# Patient Record
Sex: Female | Born: 1945 | Race: White | Hispanic: No | Marital: Single | Smoking: Never smoker
Health system: Southern US, Community
[De-identification: ages and names within clinical notes are randomized; demographics above are authoritative.]

## PROBLEM LIST (undated history)

## (undated) ENCOUNTER — Ambulatory Visit: Admission: EM | Payer: Self-pay

## (undated) DIAGNOSIS — I739 Peripheral vascular disease, unspecified: Secondary | ICD-10-CM

## (undated) DIAGNOSIS — C801 Malignant (primary) neoplasm, unspecified: Secondary | ICD-10-CM

## (undated) DIAGNOSIS — I10 Essential (primary) hypertension: Secondary | ICD-10-CM

## (undated) DIAGNOSIS — M1990 Unspecified osteoarthritis, unspecified site: Secondary | ICD-10-CM

## (undated) HISTORY — PX: HERNIA REPAIR: SHX51

## (undated) HISTORY — PX: BREAST BIOPSY: SHX20

## (undated) HISTORY — PX: BREAST CYST EXCISION: SHX579

## (undated) HISTORY — PX: ABDOMINAL HYSTERECTOMY: SHX81

---

## 2012-06-01 DIAGNOSIS — N390 Urinary tract infection, site not specified: Secondary | ICD-10-CM | POA: Insufficient documentation

## 2012-06-01 DIAGNOSIS — M1711 Unilateral primary osteoarthritis, right knee: Secondary | ICD-10-CM

## 2012-06-01 DIAGNOSIS — Z8679 Personal history of other diseases of the circulatory system: Secondary | ICD-10-CM | POA: Insufficient documentation

## 2012-06-01 DIAGNOSIS — M1710 Unilateral primary osteoarthritis, unspecified knee: Secondary | ICD-10-CM | POA: Insufficient documentation

## 2012-06-01 DIAGNOSIS — G609 Hereditary and idiopathic neuropathy, unspecified: Secondary | ICD-10-CM

## 2012-06-01 HISTORY — DX: Unilateral primary osteoarthritis, right knee: M17.11

## 2012-06-01 HISTORY — DX: Unilateral primary osteoarthritis, unspecified knee: M17.10

## 2012-06-01 HISTORY — DX: Personal history of other diseases of the circulatory system: Z86.79

## 2012-06-01 HISTORY — DX: Urinary tract infection, site not specified: N39.0

## 2012-06-01 HISTORY — DX: Hereditary and idiopathic neuropathy, unspecified: G60.9

## 2012-09-25 DIAGNOSIS — I10 Essential (primary) hypertension: Secondary | ICD-10-CM

## 2012-09-25 HISTORY — DX: Essential (primary) hypertension: I10

## 2014-01-22 DIAGNOSIS — R072 Precordial pain: Secondary | ICD-10-CM

## 2014-01-22 HISTORY — DX: Precordial pain: R07.2

## 2014-02-04 DIAGNOSIS — I209 Angina pectoris, unspecified: Secondary | ICD-10-CM | POA: Insufficient documentation

## 2014-02-04 DIAGNOSIS — R9439 Abnormal result of other cardiovascular function study: Secondary | ICD-10-CM

## 2014-02-04 HISTORY — DX: Angina pectoris, unspecified: I20.9

## 2014-02-04 HISTORY — DX: Abnormal result of other cardiovascular function study: R94.39

## 2017-03-03 DIAGNOSIS — Z96659 Presence of unspecified artificial knee joint: Secondary | ICD-10-CM | POA: Insufficient documentation

## 2017-03-03 HISTORY — DX: Presence of unspecified artificial knee joint: Z96.659

## 2017-11-15 DIAGNOSIS — M1612 Unilateral primary osteoarthritis, left hip: Secondary | ICD-10-CM

## 2017-11-15 HISTORY — DX: Unilateral primary osteoarthritis, left hip: M16.12

## 2017-12-18 HISTORY — PX: JOINT REPLACEMENT: SHX530

## 2018-01-11 DIAGNOSIS — Z96642 Presence of left artificial hip joint: Secondary | ICD-10-CM

## 2018-01-11 HISTORY — DX: Presence of left artificial hip joint: Z96.642

## 2018-04-19 HISTORY — PX: MOHS SURGERY: SHX181

## 2019-01-26 DIAGNOSIS — S8263XA Displaced fracture of lateral malleolus of unspecified fibula, initial encounter for closed fracture: Secondary | ICD-10-CM | POA: Insufficient documentation

## 2019-01-26 HISTORY — DX: Displaced fracture of lateral malleolus of unspecified fibula, initial encounter for closed fracture: S82.63XA

## 2019-09-04 DIAGNOSIS — M1991 Primary osteoarthritis, unspecified site: Secondary | ICD-10-CM

## 2019-09-04 DIAGNOSIS — I10 Essential (primary) hypertension: Secondary | ICD-10-CM

## 2019-09-04 DIAGNOSIS — M546 Pain in thoracic spine: Secondary | ICD-10-CM

## 2019-09-04 HISTORY — DX: Primary osteoarthritis, unspecified site: M19.91

## 2019-09-04 HISTORY — DX: Essential (primary) hypertension: I10

## 2019-09-04 HISTORY — DX: Pain in thoracic spine: M54.6

## 2020-05-13 DIAGNOSIS — M1711 Unilateral primary osteoarthritis, right knee: Secondary | ICD-10-CM | POA: Diagnosis not present

## 2020-05-17 DIAGNOSIS — M25561 Pain in right knee: Secondary | ICD-10-CM | POA: Diagnosis not present

## 2020-05-17 DIAGNOSIS — M1711 Unilateral primary osteoarthritis, right knee: Secondary | ICD-10-CM | POA: Diagnosis not present

## 2020-05-20 DIAGNOSIS — I10 Essential (primary) hypertension: Secondary | ICD-10-CM | POA: Diagnosis not present

## 2020-05-24 DIAGNOSIS — M1711 Unilateral primary osteoarthritis, right knee: Secondary | ICD-10-CM | POA: Diagnosis not present

## 2020-05-24 DIAGNOSIS — M25561 Pain in right knee: Secondary | ICD-10-CM | POA: Diagnosis not present

## 2020-05-28 DIAGNOSIS — S46011A Strain of muscle(s) and tendon(s) of the rotator cuff of right shoulder, initial encounter: Secondary | ICD-10-CM | POA: Diagnosis not present

## 2020-06-06 DIAGNOSIS — M1711 Unilateral primary osteoarthritis, right knee: Secondary | ICD-10-CM | POA: Diagnosis not present

## 2020-06-06 DIAGNOSIS — M25561 Pain in right knee: Secondary | ICD-10-CM | POA: Diagnosis not present

## 2020-06-12 DIAGNOSIS — M1711 Unilateral primary osteoarthritis, right knee: Secondary | ICD-10-CM | POA: Diagnosis not present

## 2020-06-14 DIAGNOSIS — M25561 Pain in right knee: Secondary | ICD-10-CM | POA: Diagnosis not present

## 2020-06-14 DIAGNOSIS — M1711 Unilateral primary osteoarthritis, right knee: Secondary | ICD-10-CM | POA: Diagnosis not present

## 2020-06-19 DIAGNOSIS — I10 Essential (primary) hypertension: Secondary | ICD-10-CM | POA: Diagnosis not present

## 2020-06-19 DIAGNOSIS — M1612 Unilateral primary osteoarthritis, left hip: Secondary | ICD-10-CM | POA: Diagnosis not present

## 2020-06-19 DIAGNOSIS — M25561 Pain in right knee: Secondary | ICD-10-CM | POA: Diagnosis not present

## 2020-06-19 DIAGNOSIS — G8918 Other acute postprocedural pain: Secondary | ICD-10-CM | POA: Diagnosis not present

## 2020-06-19 DIAGNOSIS — M1711 Unilateral primary osteoarthritis, right knee: Secondary | ICD-10-CM | POA: Diagnosis not present

## 2020-06-19 DIAGNOSIS — Z96641 Presence of right artificial hip joint: Secondary | ICD-10-CM | POA: Diagnosis not present

## 2020-06-19 DIAGNOSIS — Z880 Allergy status to penicillin: Secondary | ICD-10-CM | POA: Diagnosis not present

## 2020-06-19 DIAGNOSIS — Z96652 Presence of left artificial knee joint: Secondary | ICD-10-CM | POA: Diagnosis not present

## 2020-06-19 DIAGNOSIS — Z885 Allergy status to narcotic agent status: Secondary | ICD-10-CM | POA: Diagnosis not present

## 2020-06-20 DIAGNOSIS — Z885 Allergy status to narcotic agent status: Secondary | ICD-10-CM | POA: Diagnosis not present

## 2020-06-20 DIAGNOSIS — Z880 Allergy status to penicillin: Secondary | ICD-10-CM | POA: Diagnosis not present

## 2020-06-20 DIAGNOSIS — Z96652 Presence of left artificial knee joint: Secondary | ICD-10-CM | POA: Diagnosis not present

## 2020-06-20 DIAGNOSIS — I10 Essential (primary) hypertension: Secondary | ICD-10-CM | POA: Diagnosis not present

## 2020-06-20 DIAGNOSIS — M1711 Unilateral primary osteoarthritis, right knee: Secondary | ICD-10-CM | POA: Diagnosis not present

## 2020-06-20 DIAGNOSIS — Z96641 Presence of right artificial hip joint: Secondary | ICD-10-CM | POA: Diagnosis not present

## 2020-06-20 DIAGNOSIS — M1612 Unilateral primary osteoarthritis, left hip: Secondary | ICD-10-CM | POA: Diagnosis not present

## 2020-06-25 DIAGNOSIS — M25661 Stiffness of right knee, not elsewhere classified: Secondary | ICD-10-CM | POA: Diagnosis not present

## 2020-06-25 DIAGNOSIS — M1711 Unilateral primary osteoarthritis, right knee: Secondary | ICD-10-CM | POA: Diagnosis not present

## 2020-06-25 DIAGNOSIS — M25561 Pain in right knee: Secondary | ICD-10-CM | POA: Diagnosis not present

## 2020-06-26 DIAGNOSIS — M25561 Pain in right knee: Secondary | ICD-10-CM | POA: Diagnosis not present

## 2020-06-26 DIAGNOSIS — M1711 Unilateral primary osteoarthritis, right knee: Secondary | ICD-10-CM | POA: Diagnosis not present

## 2020-06-26 DIAGNOSIS — M25661 Stiffness of right knee, not elsewhere classified: Secondary | ICD-10-CM | POA: Diagnosis not present

## 2020-07-02 DIAGNOSIS — M25561 Pain in right knee: Secondary | ICD-10-CM | POA: Diagnosis not present

## 2020-07-02 DIAGNOSIS — M1711 Unilateral primary osteoarthritis, right knee: Secondary | ICD-10-CM | POA: Diagnosis not present

## 2020-07-02 DIAGNOSIS — M25661 Stiffness of right knee, not elsewhere classified: Secondary | ICD-10-CM | POA: Diagnosis not present

## 2020-07-04 DIAGNOSIS — M1711 Unilateral primary osteoarthritis, right knee: Secondary | ICD-10-CM | POA: Diagnosis not present

## 2020-07-04 DIAGNOSIS — M25561 Pain in right knee: Secondary | ICD-10-CM | POA: Diagnosis not present

## 2020-07-04 DIAGNOSIS — M25661 Stiffness of right knee, not elsewhere classified: Secondary | ICD-10-CM | POA: Diagnosis not present

## 2020-07-12 DIAGNOSIS — M25561 Pain in right knee: Secondary | ICD-10-CM | POA: Diagnosis not present

## 2020-07-12 DIAGNOSIS — M1711 Unilateral primary osteoarthritis, right knee: Secondary | ICD-10-CM | POA: Diagnosis not present

## 2020-07-12 DIAGNOSIS — M25661 Stiffness of right knee, not elsewhere classified: Secondary | ICD-10-CM | POA: Diagnosis not present

## 2020-07-15 DIAGNOSIS — M25661 Stiffness of right knee, not elsewhere classified: Secondary | ICD-10-CM | POA: Diagnosis not present

## 2020-07-15 DIAGNOSIS — M25561 Pain in right knee: Secondary | ICD-10-CM | POA: Diagnosis not present

## 2020-07-15 DIAGNOSIS — M1711 Unilateral primary osteoarthritis, right knee: Secondary | ICD-10-CM | POA: Diagnosis not present

## 2020-07-18 DIAGNOSIS — M25661 Stiffness of right knee, not elsewhere classified: Secondary | ICD-10-CM | POA: Diagnosis not present

## 2020-07-18 DIAGNOSIS — M1711 Unilateral primary osteoarthritis, right knee: Secondary | ICD-10-CM | POA: Diagnosis not present

## 2020-07-18 DIAGNOSIS — M25561 Pain in right knee: Secondary | ICD-10-CM | POA: Diagnosis not present

## 2020-07-22 DIAGNOSIS — Z96651 Presence of right artificial knee joint: Secondary | ICD-10-CM | POA: Diagnosis not present

## 2020-07-22 DIAGNOSIS — R600 Localized edema: Secondary | ICD-10-CM | POA: Diagnosis not present

## 2020-07-22 DIAGNOSIS — M1711 Unilateral primary osteoarthritis, right knee: Secondary | ICD-10-CM | POA: Diagnosis not present

## 2020-07-25 DIAGNOSIS — M1711 Unilateral primary osteoarthritis, right knee: Secondary | ICD-10-CM | POA: Diagnosis not present

## 2020-07-25 DIAGNOSIS — M25561 Pain in right knee: Secondary | ICD-10-CM | POA: Diagnosis not present

## 2020-07-25 DIAGNOSIS — M25661 Stiffness of right knee, not elsewhere classified: Secondary | ICD-10-CM | POA: Diagnosis not present

## 2020-07-31 DIAGNOSIS — M25561 Pain in right knee: Secondary | ICD-10-CM | POA: Diagnosis not present

## 2020-07-31 DIAGNOSIS — M1711 Unilateral primary osteoarthritis, right knee: Secondary | ICD-10-CM | POA: Diagnosis not present

## 2020-07-31 DIAGNOSIS — M25661 Stiffness of right knee, not elsewhere classified: Secondary | ICD-10-CM | POA: Diagnosis not present

## 2020-08-02 DIAGNOSIS — M25661 Stiffness of right knee, not elsewhere classified: Secondary | ICD-10-CM | POA: Diagnosis not present

## 2020-08-02 DIAGNOSIS — M25561 Pain in right knee: Secondary | ICD-10-CM | POA: Diagnosis not present

## 2020-08-02 DIAGNOSIS — M1711 Unilateral primary osteoarthritis, right knee: Secondary | ICD-10-CM | POA: Diagnosis not present

## 2020-08-07 DIAGNOSIS — M1711 Unilateral primary osteoarthritis, right knee: Secondary | ICD-10-CM | POA: Diagnosis not present

## 2020-08-07 DIAGNOSIS — M25561 Pain in right knee: Secondary | ICD-10-CM | POA: Diagnosis not present

## 2020-08-07 DIAGNOSIS — M25661 Stiffness of right knee, not elsewhere classified: Secondary | ICD-10-CM | POA: Diagnosis not present

## 2020-08-09 DIAGNOSIS — M25661 Stiffness of right knee, not elsewhere classified: Secondary | ICD-10-CM | POA: Diagnosis not present

## 2020-08-09 DIAGNOSIS — M1711 Unilateral primary osteoarthritis, right knee: Secondary | ICD-10-CM | POA: Diagnosis not present

## 2020-08-09 DIAGNOSIS — M25561 Pain in right knee: Secondary | ICD-10-CM | POA: Diagnosis not present

## 2020-08-19 DIAGNOSIS — Z86010 Personal history of colonic polyps: Secondary | ICD-10-CM | POA: Diagnosis not present

## 2020-08-19 DIAGNOSIS — K625 Hemorrhage of anus and rectum: Secondary | ICD-10-CM | POA: Diagnosis not present

## 2020-08-27 DIAGNOSIS — M1909 Primary osteoarthritis, other specified site: Secondary | ICD-10-CM | POA: Diagnosis not present

## 2020-08-27 DIAGNOSIS — I10 Essential (primary) hypertension: Secondary | ICD-10-CM | POA: Diagnosis not present

## 2020-09-02 ENCOUNTER — Emergency Department
Admission: EM | Admit: 2020-09-02 | Discharge: 2020-09-02 | Disposition: A | Discharge status: Home / Self Care | Payer: Medicare HMO | Attending: Emergency Medicine | Admitting: Emergency Medicine

## 2020-09-02 ENCOUNTER — Other Ambulatory Visit: Payer: Self-pay

## 2020-09-02 DIAGNOSIS — S51831A Puncture wound without foreign body of right forearm, initial encounter: Secondary | ICD-10-CM | POA: Diagnosis not present

## 2020-09-02 DIAGNOSIS — Z7982 Long term (current) use of aspirin: Secondary | ICD-10-CM | POA: Insufficient documentation

## 2020-09-02 DIAGNOSIS — W5501XA Bitten by cat, initial encounter: Secondary | ICD-10-CM | POA: Diagnosis not present

## 2020-09-02 DIAGNOSIS — S51851A Open bite of right forearm, initial encounter: Secondary | ICD-10-CM | POA: Diagnosis not present

## 2020-09-02 DIAGNOSIS — S59911A Unspecified injury of right forearm, initial encounter: Secondary | ICD-10-CM | POA: Diagnosis present

## 2020-09-02 MED ORDER — CLINDAMYCIN PHOSPHATE 600 MG/50ML IV SOLN
600.0000 mg | Freq: Once | INTRAVENOUS | Status: AC
  Administered 2020-09-02: 600 mg via INTRAVENOUS
  Filled 2020-09-02: qty 50

## 2020-09-02 MED ORDER — CLINDAMYCIN HCL 300 MG PO CAPS: 300.0000 mg | ORAL_CAPSULE | Freq: Three times a day (TID) | ORAL | 0 refills | Status: AC

## 2020-09-02 MED ORDER — DOXYCYCLINE MONOHYDRATE 100 MG PO CAPS: 100.0000 mg | ORAL_CAPSULE | Freq: Two times a day (BID) | ORAL | 0 refills | Status: DC

## 2020-09-02 NOTE — ED Triage Notes (Signed)
PT reports that she was bitten yesterday by a friends cat, cat has all vaccinations UTD. Pt was bitten to rt forearm.

## 2020-09-02 NOTE — ED Provider Notes (Signed)
[facility] Emergency Department Provider Note   ____________________________________________   Event Date/Time   First MD Initiated Contact with Patient 09/02/20 1102     (approximate)  I have reviewed the triage vital signs and the nursing notes.   HISTORY  Chief Complaint Animal Bite    HPI Sara Nguyen is a 75 y.o. female patient presents with cat bite to the right forearm.  Incident occurred yesterday.  Patient has documentation showed that the cat immunizations are up-to-date.  Patient last tetanus shot was 4 years ago.  Patient was sent from the urgent care clinic for IV antibiotics.  Patient rates her pain as a 6/10.  Patient described pain as "sore".  No palliative measure prior to arrival.  Patient denies loss of sensation or function.         No past medical history on file.  There are no problems to display for this patient.     Prior to Admission medications   Medication Sig Start Date End Date Taking? Authorizing Provider  aspirin EC 81 MG tablet Take 81 mg by mouth daily. Swallow whole.   Yes [provider]  clindamycin (CLEOCIN) 300 MG capsule Take 1 capsule (300 mg total) by mouth 3 (three) times daily for 10 days. 09/02/20 09/12/20 Yes Sable Feil, PA-C  doxycycline (MONODOX) 100 MG capsule Take 1 capsule (100 mg total) by mouth 2 (two) times daily. 09/02/20  Yes Sable Feil, PA-C  hydrochlorothiazide (HYDRODIURIL) 25 MG tablet Take 25 mg by mouth daily.   Yes [provider]  losartan (COZAAR) 25 MG tablet Take 25 mg by mouth daily.   Yes [provider]    Allergies Codeine, Morphine and related, Penicillins, Tramadol, and Tylenol [acetaminophen]  No family history on file.  Social History    Review of Systems Constitutional: No fever/chills Eyes: No visual changes. ENT: No sore throat. Cardiovascular: Denies chest pain. Respiratory: Denies shortness of  breath. Gastrointestinal: No abdominal pain.  No nausea, no vomiting.  No diarrhea.  No constipation. Genitourinary: Negative for dysuria. Musculoskeletal: Negative for back pain. Skin: Negative for rash.  Cat bite right forearm. Neurological: Negative for headaches, focal weakness or numbness. Endocrine:  Hypertension. Allergic/Immunilogical: Codeine, morphine, penicillin, tramadol, and Tylenol. ____________________________________________   PHYSICAL EXAM:  VITAL SIGNS: ED Triage Vitals  Enc Vitals Group     BP 09/02/20 1047 (!) 168/99     Pulse Rate 09/02/20 1047 84     Resp 09/02/20 1047 18     Temp 09/02/20 1047 98.3 F (36.8 C)     Temp Source 09/02/20 1047 Oral     SpO2 09/02/20 1047 99 %     Weight 09/02/20 1048 173 lb (78.5 kg)     Height 09/02/20 1048 5\' 7"  (1.702 m)     Head Circumference --      Peak Flow --      Pain Score 09/02/20 1047 6     Pain Loc --      Pain Edu? --      Excl. in [place]? --     Constitutional: Alert and oriented. Well appearing and in no acute distress. Cardiovascular: Normal rate, regular rhythm. Grossly normal heart sounds.  Good peripheral circulation.  Elevated blood pressure. Respiratory: Normal respiratory effort.  No retractions. Lungs CTAB. Musculoskeletal: No lower extremity tenderness nor edema.  No joint effusions. Neurologic:  Normal speech and language. No gross focal neurologic deficits are appreciated. No gait instability. Skin: 4  puncture wounds to right forearm.  Mild erythema.   Psychiatric: Mood and affect are normal. Speech and behavior are normal.  ____________________________________________   LABS (all labs ordered are listed, but only abnormal results are displayed)  Labs Reviewed - No data to display ____________________________________________  EKG   ____________________________________________  RADIOLOGY I, Sable Feil, personally viewed and evaluated these images (plain radiographs) as part of my  medical decision making, as well as reviewing the written report by the radiologist.  ED MD interpretation:    Official radiology report(s): No results found.  ____________________________________________   PROCEDURES  Procedure(s) performed (including Critical Care):  Procedures   ____________________________________________   INITIAL IMPRESSION / ASSESSMENT AND PLAN / ED COURSE  As part of my medical decision making, I reviewed the following data within the [facility]         Patient presents with cellulitis secondary to cat bite to the right forearm.  Patient given IV antibiotics in the ED and switch to oral medication upon discharge.  Patient advised to return back to ED if condition worsens.  Verified that cats immunizations are up-to-date.      ____________________________________________   FINAL CLINICAL IMPRESSION(S) / ED DIAGNOSES  Final diagnoses:  Cat bite, initial encounter     ED Discharge Orders         Ordered    doxycycline (MONODOX) 100 MG capsule  2 times daily        09/02/20 1148    clindamycin (CLEOCIN) 300 MG capsule  3 times daily        09/02/20 1148          *Please note:  Sara Nguyen was evaluated in Emergency Department on 09/02/2020 for the symptoms described in the history of present illness. She was evaluated in the context of the global COVID-19 pandemic, which necessitated consideration that the patient might be at risk for infection with the SARS-CoV-2 virus that causes COVID-19. Institutional protocols and algorithms that pertain to the evaluation of patients at risk for COVID-19 are in a state of rapid change based on information released by regulatory bodies including the CDC and federal and state organizations. These policies and algorithms were followed during the patient's care in the ED.  Some ED evaluations and interventions may be delayed as a result of limited staffing during and the  pandemic.*   Note:  This document was prepared using Dragon voice recognition software and may include unintentional dictation errors.    Sable Feil, PA-C 09/02/20 1148    Harvest Dark, MD 09/02/20 [phone]

## 2020-09-02 NOTE — ED Notes (Signed)
See triage note.

## 2020-09-02 NOTE — Discharge Instructions (Signed)
Read and follow discharge care instructions.  Take medication as directed.  Return to ED if condition worsens.

## 2020-09-02 NOTE — ED Notes (Signed)
See triage note  Presents with cat bite to right forearm  States was bitten yesterday  Arm is red and swollen  Tender to touch

## 2020-09-18 DIAGNOSIS — Z0000 Encounter for general adult medical examination without abnormal findings: Secondary | ICD-10-CM | POA: Diagnosis not present

## 2020-09-18 DIAGNOSIS — W5501XD Bitten by cat, subsequent encounter: Secondary | ICD-10-CM | POA: Diagnosis not present

## 2020-09-18 DIAGNOSIS — Z9889 Other specified postprocedural states: Secondary | ICD-10-CM

## 2020-09-18 DIAGNOSIS — I10 Essential (primary) hypertension: Secondary | ICD-10-CM | POA: Diagnosis not present

## 2020-09-18 DIAGNOSIS — M2550 Pain in unspecified joint: Secondary | ICD-10-CM | POA: Diagnosis not present

## 2020-09-18 DIAGNOSIS — Z1231 Encounter for screening mammogram for malignant neoplasm of breast: Secondary | ICD-10-CM | POA: Diagnosis not present

## 2020-09-18 DIAGNOSIS — L989 Disorder of the skin and subcutaneous tissue, unspecified: Secondary | ICD-10-CM | POA: Diagnosis not present

## 2020-09-18 HISTORY — DX: Other specified postprocedural states: Z98.890

## 2020-09-23 ENCOUNTER — Other Ambulatory Visit: Payer: Self-pay | Admitting: Infectious Diseases

## 2020-09-23 DIAGNOSIS — Z1231 Encounter for screening mammogram for malignant neoplasm of breast: Secondary | ICD-10-CM

## 2020-09-30 ENCOUNTER — Other Ambulatory Visit: Payer: Self-pay

## 2020-09-30 ENCOUNTER — Ambulatory Visit
Admission: RE | Admit: 2020-09-30 | Discharge: 2020-09-30 | Disposition: A | Discharge status: Home / Self Care | Payer: Medicare HMO | Source: Ambulatory Visit | Attending: Infectious Diseases | Admitting: Infectious Diseases

## 2020-09-30 DIAGNOSIS — Z1231 Encounter for screening mammogram for malignant neoplasm of breast: Secondary | ICD-10-CM | POA: Diagnosis not present

## 2020-10-01 ENCOUNTER — Inpatient Hospital Stay
Admission: RE | Admit: 2020-10-01 | Discharge: 2020-10-01 | Disposition: A | Discharge status: Home / Self Care | Payer: Self-pay | Source: Ambulatory Visit | Attending: *Deleted | Admitting: *Deleted

## 2020-10-01 ENCOUNTER — Other Ambulatory Visit: Payer: Self-pay | Admitting: *Deleted

## 2020-10-01 DIAGNOSIS — Z1231 Encounter for screening mammogram for malignant neoplasm of breast: Secondary | ICD-10-CM

## 2020-10-07 DIAGNOSIS — L821 Other seborrheic keratosis: Secondary | ICD-10-CM | POA: Diagnosis not present

## 2020-10-07 DIAGNOSIS — L298 Other pruritus: Secondary | ICD-10-CM | POA: Diagnosis not present

## 2020-10-07 DIAGNOSIS — L578 Other skin changes due to chronic exposure to nonionizing radiation: Secondary | ICD-10-CM | POA: Diagnosis not present

## 2020-10-07 DIAGNOSIS — L905 Scar conditions and fibrosis of skin: Secondary | ICD-10-CM | POA: Diagnosis not present

## 2020-10-07 DIAGNOSIS — L304 Erythema intertrigo: Secondary | ICD-10-CM | POA: Diagnosis not present

## 2020-10-13 DIAGNOSIS — Z6830 Body mass index (BMI) 30.0-30.9, adult: Secondary | ICD-10-CM | POA: Diagnosis not present

## 2020-10-13 DIAGNOSIS — R42 Dizziness and giddiness: Secondary | ICD-10-CM | POA: Diagnosis not present

## 2020-10-13 DIAGNOSIS — R079 Chest pain, unspecified: Secondary | ICD-10-CM | POA: Diagnosis not present

## 2020-10-13 DIAGNOSIS — R000 Tachycardia, unspecified: Secondary | ICD-10-CM | POA: Diagnosis not present

## 2020-10-13 DIAGNOSIS — E6609 Other obesity due to excess calories: Secondary | ICD-10-CM | POA: Diagnosis not present

## 2020-10-13 DIAGNOSIS — I208 Other forms of angina pectoris: Secondary | ICD-10-CM | POA: Diagnosis not present

## 2020-10-13 DIAGNOSIS — R002 Palpitations: Secondary | ICD-10-CM | POA: Diagnosis not present

## 2020-10-13 DIAGNOSIS — I10 Essential (primary) hypertension: Secondary | ICD-10-CM | POA: Diagnosis not present

## 2020-10-17 DIAGNOSIS — R309 Painful micturition, unspecified: Secondary | ICD-10-CM | POA: Diagnosis not present

## 2020-10-17 DIAGNOSIS — R3915 Urgency of urination: Secondary | ICD-10-CM | POA: Diagnosis not present

## 2020-11-06 ENCOUNTER — Ambulatory Visit: Admit: 2020-11-06 | Payer: Self-pay

## 2020-11-06 SURGERY — COLONOSCOPY WITH PROPOFOL
Anesthesia: General

## 2020-11-11 ENCOUNTER — Other Ambulatory Visit: Payer: Self-pay

## 2020-11-11 ENCOUNTER — Encounter: Payer: Self-pay | Admitting: *Deleted

## 2020-11-11 ENCOUNTER — Emergency Department
Admission: EM | Admit: 2020-11-11 | Discharge: 2020-11-12 | Disposition: A | Discharge status: Home / Self Care | Payer: Medicare HMO | Attending: Emergency Medicine | Admitting: Emergency Medicine

## 2020-11-11 ENCOUNTER — Emergency Department: Payer: Medicare HMO

## 2020-11-11 DIAGNOSIS — Z79899 Other long term (current) drug therapy: Secondary | ICD-10-CM | POA: Diagnosis not present

## 2020-11-11 DIAGNOSIS — Z87440 Personal history of urinary (tract) infections: Secondary | ICD-10-CM | POA: Insufficient documentation

## 2020-11-11 DIAGNOSIS — N12 Tubulo-interstitial nephritis, not specified as acute or chronic: Secondary | ICD-10-CM | POA: Insufficient documentation

## 2020-11-11 DIAGNOSIS — K5790 Diverticulosis of intestine, part unspecified, without perforation or abscess without bleeding: Secondary | ICD-10-CM | POA: Insufficient documentation

## 2020-11-11 DIAGNOSIS — Z7982 Long term (current) use of aspirin: Secondary | ICD-10-CM | POA: Insufficient documentation

## 2020-11-11 DIAGNOSIS — K760 Fatty (change of) liver, not elsewhere classified: Secondary | ICD-10-CM | POA: Diagnosis not present

## 2020-11-11 DIAGNOSIS — Z96642 Presence of left artificial hip joint: Secondary | ICD-10-CM | POA: Insufficient documentation

## 2020-11-11 DIAGNOSIS — Z9889 Other specified postprocedural states: Secondary | ICD-10-CM | POA: Diagnosis not present

## 2020-11-11 DIAGNOSIS — R350 Frequency of micturition: Secondary | ICD-10-CM | POA: Diagnosis present

## 2020-11-11 DIAGNOSIS — I10 Essential (primary) hypertension: Secondary | ICD-10-CM | POA: Diagnosis not present

## 2020-11-11 LAB — CBC
HCT: 39.3 % (ref 36.0–46.0)
Hemoglobin: 14.1 g/dL (ref 12.0–15.0)
MCH: 33.8 pg (ref 26.0–34.0)
MCHC: 35.9 g/dL (ref 30.0–36.0)
MCV: 94.2 fL (ref 80.0–100.0)
Platelets: 248 10*3/uL (ref 150–400)
RBC: 4.17 MIL/uL (ref 3.87–5.11)
RDW: 13.8 % (ref 11.5–15.5)
WBC: 10.5 10*3/uL (ref 4.0–10.5)
nRBC: 0 % (ref 0.0–0.2)

## 2020-11-11 LAB — URINALYSIS, COMPLETE (UACMP) WITH MICROSCOPIC
Bilirubin Urine: NEGATIVE
Glucose, UA: NEGATIVE mg/dL
Hgb urine dipstick: NEGATIVE
Ketones, ur: NEGATIVE mg/dL
Nitrite: NEGATIVE
Protein, ur: 100 mg/dL — AB
Specific Gravity, Urine: 1.015 (ref 1.005–1.030)
WBC, UA: >50 WBC/hpf — ABNORMAL HIGH (ref 0–5)
pH: 6 (ref 5.0–8.0)

## 2020-11-11 LAB — COMPREHENSIVE METABOLIC PANEL
ALT: 19 U/L (ref 0–44)
AST: 23 U/L (ref 15–41)
Albumin: 4 g/dL (ref 3.5–5.0)
Alkaline Phosphatase: 87 U/L (ref 38–126)
Anion gap: 11 (ref 5–15)
BUN: 25 mg/dL — ABNORMAL HIGH (ref 8–23)
CO2: 23 mmol/L (ref 22–32)
Calcium: 9.5 mg/dL (ref 8.9–10.3)
Chloride: 102 mmol/L (ref 98–111)
Creatinine, Ser: 1.07 mg/dL — ABNORMAL HIGH (ref 0.44–1.00)
GFR, Estimated: 55 mL/min — ABNORMAL LOW (ref >60)
Glucose, Bld: 135 mg/dL — ABNORMAL HIGH (ref 70–99)
Potassium: 3.7 mmol/L (ref 3.5–5.1)
Sodium: 136 mmol/L (ref 135–145)
Total Bilirubin: 1.2 mg/dL (ref 0.3–1.2)
Total Protein: 7.1 g/dL (ref 6.5–8.1)

## 2020-11-11 LAB — LACTIC ACID, PLASMA: Lactic Acid, Venous: 0.9 mmol/L (ref 0.5–1.9)

## 2020-11-11 LAB — PROCALCITONIN: Procalcitonin: 1.02 ng/mL

## 2020-11-11 MED ORDER — CIPROFLOXACIN IN D5W 400 MG/200ML IV SOLN
400.0000 mg | Freq: Once | INTRAVENOUS | Status: AC
  Administered 2020-11-11: 400 mg via INTRAVENOUS
  Filled 2020-11-11: qty 200

## 2020-11-11 MED ORDER — LACTATED RINGERS IV BOLUS
1000.0000 mL | Freq: Once | INTRAVENOUS | Status: AC
  Administered 2020-11-11: 1000 mL via INTRAVENOUS

## 2020-11-11 MED ORDER — IOHEXOL 350 MG/ML SOLN
75.0000 mL | Freq: Once | INTRAVENOUS | Status: AC | PRN
  Administered 2020-11-11: 75 mL via INTRAVENOUS

## 2020-11-11 NOTE — ED Triage Notes (Signed)
Pt with recent uti and taking cipro.  Pt reports sx worse and not feeling well.  Pt has nausea.  Pt alert

## 2020-11-11 NOTE — ED Notes (Signed)
Pt unable to void at this time. 

## 2020-11-11 NOTE — ED Provider Notes (Signed)
[facility] Emergency Department Provider Note  ____________________________________________   Event Date/Time   First MD Initiated Contact with Patient 11/11/20 2253     (approximate)  I have reviewed the triage vital signs and the nursing notes.   HISTORY  Chief Complaint Dysuria and Back Pain   HPI TORIANN PACKER is a 75 y.o. female with a past medical history of recurrent UTIs who presents for assessment of their 4 days of worsening burning with urination and frequency associate with 1 day of some left lower back pain rating around the left flank associated with some subjective fevers and decreased p.o. intake.  Patient states she had to take 1 dose of ciprofloxacin was prescribed her but she feels has not yet helped.  She states she got over a UTI earlier in the month with Cipro and Cipro usually works for her but she has never had pain in her back.  She denies any headache, earache, sore throat, vomiting, chest pain, cough, shortness of breath, right-sided back or abdominal pain, abnormal vaginal bleeding or discharge, rash or extremity pain.  She has no history of stones or kidney infections.  She has no other acute concerns at this time.  She did take 2 ibuprofen prior to arrival.         No past medical history on file.  There are no problems to display for this patient.   Past Surgical History:  Procedure Laterality Date   BREAST BIOPSY Right    unknown year- benign   BREAST CYST EXCISION Left    1965 or so- benign    Prior to Admission medications   Medication Sig Start Date End Date Taking? Authorizing Provider  ciprofloxacin (CIPRO) 500 MG tablet Take 1 tablet (500 mg total) by mouth 2 (two) times daily for 7 days. 11/12/20 11/19/20 Yes Lucrezia Starch, MD  aspirin EC 81 MG tablet Take 81 mg by mouth daily. Swallow whole.    [provider]  hydrochlorothiazide (HYDRODIURIL) 25 MG tablet Take 25 mg by mouth daily.     [provider]  losartan (COZAAR) 25 MG tablet Take 25 mg by mouth daily.    [provider]    Allergies Codeine, Morphine and related, Penicillins, Tramadol, and Tylenol [acetaminophen]  Family History  Problem Relation Age of Onset   Breast cancer Cousin     Social History Social History   Tobacco Use   Smoking status: Never   Smokeless tobacco: Never  Substance Use Topics   Alcohol use: Yes    Review of Systems  Review of Systems  Constitutional:  Positive for chills, fever and malaise/fatigue.  HENT:  Negative for sore throat.   Eyes:  Negative for pain.  Respiratory:  Negative for cough and stridor.   Cardiovascular:  Negative for chest pain.  Gastrointestinal:  Positive for nausea. Negative for vomiting.  Genitourinary:  Positive for dysuria, flank pain, frequency and urgency.  Musculoskeletal:  Positive for back pain (L lower). Negative for myalgias.  Skin:  Negative for rash.  Neurological:  Negative for seizures, loss of consciousness and headaches.  Psychiatric/Behavioral:  Negative for suicidal ideas.   All other systems reviewed and are negative.    ____________________________________________   PHYSICAL EXAM:  VITAL SIGNS: ED Triage Vitals  Enc Vitals Group     BP 11/11/20 2011 (!) 160/90     Pulse Rate 11/11/20 2011 (!) 120     Resp 11/11/20 2011 20     Temp  11/11/20 2011 99.9 F (37.7 C)     Temp Source 11/11/20 2011 Oral     SpO2 11/11/20 2011 100 %     Weight 11/11/20 2009 171 lb (77.6 kg)     Height 11/11/20 2009 '5\' 7"'$  (1.702 m)     Head Circumference --      Peak Flow --      Pain Score 11/11/20 2009 4     Pain Loc --      Pain Edu? --      Excl. in [place]? --    Vitals:   11/11/20 2011 11/12/20 0000  BP: (!) 160/90 (!) 155/81  Pulse: (!) 120 89  Resp: 20 18  Temp: 99.9 F (37.7 C)   SpO2: 100% 99%   Physical Exam Vitals and nursing note reviewed.  Constitutional:      General: She is not in acute  distress.    Appearance: She is well-developed.  HENT:     Head: Normocephalic and atraumatic.     Right Ear: External ear normal.     Left Ear: External ear normal.     Nose: Nose normal.     Mouth/Throat:     Mouth: Mucous membranes are dry.  Eyes:     Conjunctiva/sclera: Conjunctivae normal.  Cardiovascular:     Rate and Rhythm: Regular rhythm. Tachycardia present.     Pulses: Normal pulses.     Heart sounds: No murmur heard. Pulmonary:     Effort: Pulmonary effort is normal. No respiratory distress.     Breath sounds: Normal breath sounds.  Abdominal:     Palpations: Abdomen is soft.     Tenderness: There is no abdominal tenderness. There is left CVA tenderness. There is no right CVA tenderness.  Musculoskeletal:     Cervical back: Neck supple.  Skin:    General: Skin is warm and dry.     Capillary Refill: Capillary refill takes less than 2 seconds.  Neurological:     Mental Status: She is alert and oriented to person, place, and time.     ____________________________________________   LABS (all labs ordered are listed, but only abnormal results are displayed)  Labs Reviewed  COMPREHENSIVE METABOLIC PANEL - Abnormal; Notable for the following components:      Result Value   Glucose, Bld 135 (*)    BUN 25 (*)    Creatinine, Ser 1.07 (*)    GFR, Estimated 55 (*)    All other components within normal limits  URINALYSIS, COMPLETE (UACMP) WITH MICROSCOPIC - Abnormal; Notable for the following components:   Color, Urine YELLOW (*)    APPearance CLOUDY (*)    Protein, ur 100 (*)    Leukocytes,Ua LARGE (*)    WBC, UA >50 (*)    Bacteria, UA RARE (*)    All other components within normal limits  URINE CULTURE  CULTURE, BLOOD (SINGLE) W REFLEX TO ID PANEL  CBC  LACTIC ACID, PLASMA  PROCALCITONIN  PROCALCITONIN   ____________________________________________  EKG  ____________________________________________  RADIOLOGY  ED MD interpretation: CT abdomen pelvis  markable for possible tiny cystocele without any other clear acute urinary tract abnormalities noted.  No evidence of kidney stone hydronephrosis or perinephric stranding.  There is diverticulosis without evidence of diverticulitis and no evidence of appendicitis, pancreatitis or other clear acute abdominopelvic pathology.   Official radiology report(s): CT ABDOMEN PELVIS W CONTRAST  Result Date: 11/12/2020 CLINICAL DATA:  Recent UTI, ciprofloxacin, worsening symptoms and generalized malaise. EXAM: CT  ABDOMEN AND PELVIS WITH CONTRAST TECHNIQUE: Multidetector CT imaging of the abdomen and pelvis was performed using the standard protocol following bolus administration of intravenous contrast. CONTRAST:  57m OMNIPAQUE IOHEXOL 350 MG/ML SOLN COMPARISON:  None. FINDINGS: Lower chest: Atelectatic changes otherwise clear lung bases. Normal heart size. No pericardial effusion. Hepatobiliary: Diffuse hepatic hypoattenuation compatible with hepatic steatosis. No concerning focal liver lesion. Smooth liver surface contour. Normal gallbladder and biliary tree. No visible calcified gallstone or biliary ductal dilatation. Pancreas: No pancreatic ductal dilatation or surrounding inflammatory changes. Spleen: Normal in size. No concerning splenic lesions. Adrenals/Urinary Tract: Normal adrenals. Kidneys are normally located with symmetric enhancementand excretion. Bilateral extrarenal pelves, anatomic variant. No suspicious renal lesion, urolithiasis or hydronephrosis. Question tiny cystocele, urinary bladder is otherwise unremarkable. (6/64, 2/74). Stomach/Bowel: Distal esophagus, stomach and duodenal sweep are unremarkable. No small bowel wall thickening or dilatation. No evidence of obstruction. A normal appendix is visualized. No colonic dilatation or wall thickening. Hepatic flexure interposed anterior to the liver. And colonic diverticulosis without focal inflammation to suggest an acute diverticulitis.  Vascular/Lymphatic: Atherosclerotic calcifications within the abdominal aorta and branch vessels. No aneurysm or ectasia. No enlarged abdominopelvic lymph nodes. Reproductive: Uterus is surgically absent. No concerning adnexal lesions. Concerning adnexal abnormality the portion of the left hemipelvis is obscured by streak artifact. Other: No abdominopelvic free air or fluid. No bowel containing hernia. Evidence of prior ventral and right groin hernia repair. Additional postsurgical changes related to left hip arthroplasty further detailed below. Musculoskeletal: Evidence of prior total left hip arthroplasty in expected alignment without acute hardware complication or failure. 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 Additional degenerative changes in the SI joints, symphysis pubis and with discogenic and facet degenerative changes in the lower lumbar spine. Grade 1 anterolisthesis L3 on 4 without spondylolysis. IMPRESSION: 1. Question a tiny cystocele, correlate with patient's symptoms. 2. No other acute urinary tract abnormality is seen. Specifically, no evidence of urolithiasis, hydronephrosis or CT evidence of urinary tract infection at this time. Hepatic steatosis. 3. Diverticulosis without evidence of acute diverticulitis. 4. 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 5. Remote ventral and right groin hernia repair. 6. Prior hysterectomy. 7. Aortic Atherosclerosis (ICD10-I70.0). Electronically Signed   By: PLovena LeM.D.   On: 11/12/2020 00:53    ____________________________________________   PROCEDURES  Procedure(s) performed (including Critical Care):  .1-3 Lead EKG Interpretation  Date/Time: 11/12/2020 1:48 AM Performed by: SLucrezia Starch MD Authorized by: SLucrezia Starch MD     Interpretation: normal     ECG rate assessment: normal     Rhythm: sinus rhythm     Ectopy: none     Conduction:  normal     ____________________________________________   INITIAL IMPRESSION / ASSESSMENT AND PLAN / ED COURSE      Presents with above-stated history exam for assessment of couple days of some burning with urination and frequency associated with 1 day of some left lower back pain rating around the left flank and some subjective fevers.  On arrival patient is tachycardic at 120 with slight elevated temperature at 99.9, hypertensive at 160/90 with otherwise stable vital signs on room air.  Her abdomen is soft nontender throughout she does have some left CVA tenderness and appears quite dehydrated on exam.  Primary differential includes pyelonephritis, kidney stone, cystitis, and diverticulitis.  No rash to suggest shingles or midline or right-sided symptoms to suggest pancreatitis or cholecystitis.  CMP  shows no significant electrolyte or metabolic derangements.  CBC shows no leukocytosis or acute anemia.  UA suggestive of urinary tract infection with large leukocyte esterase and 11-20 WBCs and 3-50 WBCs with bacteria seen.  Lactic acid is nonelevated 0.9.  CT abdomen pelvis markable for possible tiny cystocele without any other clear acute urinary tract abnormalities noted.  No evidence of kidney stone hydronephrosis or perinephric stranding.  There is diverticulosis without evidence of diverticulitis and no evidence of appendicitis, pancreatitis or other clear acute abdominopelvic pathology.   While there is no evidence of perinephric stranding on CT given CVA tenderness no findings on UA clinically patient is pyelonephritis.  She was initially tachycardic this may been due to pain is resolved on reassessment and overall do not believe she is septic at this time she has no leukocytosis, nonelevated lactic acid, no hypotension or other suggestive factors at this time.  Pending initial work-up prior to labs returning and CT she did receive an IV dose of Cipro given allergy to penicillins and blood  and urine cultures were ordered.  She declined any IV analgesia or antiemetics in the emergency room.  I do think she is stable for discharge with close outpatient follow-up.  She is currently on Cipro on a 5-day course although will extend this to 7 days.  Instructed follow-up with PCP.  Discharge stable condition.  Strict return cautions advised and discussed.  Patient declined Rx for antiemetic.     ____________________________________________   FINAL CLINICAL IMPRESSION(S) / ED DIAGNOSES  Final diagnoses:  Pyelonephritis    Medications  lactated ringers bolus 1,000 mL (0 mLs Intravenous Stopped 11/12/20 0025)  ciprofloxacin (CIPRO) IVPB 400 mg (0 mg Intravenous Stopped 11/12/20 0044)  iohexol (OMNIPAQUE) 350 MG/ML injection 75 mL (75 mLs Intravenous Contrast Given 11/11/20 2334)  ibuprofen (ADVIL) tablet 400 mg (400 mg Oral Given 11/12/20 0111)     ED Discharge Orders          Ordered    ciprofloxacin (CIPRO) 500 MG tablet  2 times daily        11/12/20 0127             Note:  This document was prepared using Dragon voice recognition software and may include unintentional dictation errors.    Lucrezia Starch, MD 11/12/20 [phone]

## 2020-11-12 DIAGNOSIS — N12 Tubulo-interstitial nephritis, not specified as acute or chronic: Secondary | ICD-10-CM | POA: Diagnosis not present

## 2020-11-12 MED ORDER — IBUPROFEN 400 MG PO TABS
400.0000 mg | ORAL_TABLET | Freq: Once | ORAL | Status: AC
  Administered 2020-11-12: 400 mg via ORAL
  Filled 2020-11-12: qty 1

## 2020-11-12 MED ORDER — CIPROFLOXACIN HCL 500 MG PO TABS: 500.0000 mg | ORAL_TABLET | Freq: Two times a day (BID) | ORAL | 0 refills | Status: AC

## 2020-11-14 LAB — URINE CULTURE: Culture: 30000 — AB

## 2020-11-15 NOTE — Progress Notes (Signed)
Patient grew ESBL E. Coli, resistant to ciprofloxacin. Spoke with patient, who stated Dr. Ola Spurr had already followed up with her regarding culture results and changed antibiotics. Nothing further to be done by pharmacy.    Wynelle Cleveland, PharmD Pharmacy Resident  11/15/2020 3:08 PM

## 2020-11-17 DIAGNOSIS — N390 Urinary tract infection, site not specified: Secondary | ICD-10-CM | POA: Diagnosis not present

## 2020-12-24 ENCOUNTER — Encounter: Payer: Self-pay | Admitting: *Deleted

## 2020-12-25 ENCOUNTER — Encounter
Admission: RE | Disposition: A | Discharge status: Home / Self Care | Payer: Self-pay | Source: Home / Self Care | Attending: Gastroenterology

## 2020-12-25 ENCOUNTER — Ambulatory Visit: Payer: Medicare HMO | Admitting: Anesthesiology

## 2020-12-25 ENCOUNTER — Ambulatory Visit
Admission: RE | Admit: 2020-12-25 | Discharge: 2020-12-25 | Disposition: A | Discharge status: Home / Self Care | Payer: Medicare HMO | Attending: Gastroenterology | Admitting: Gastroenterology

## 2020-12-25 ENCOUNTER — Encounter: Payer: Self-pay | Admitting: Anesthesiology

## 2020-12-25 DIAGNOSIS — Z8719 Personal history of other diseases of the digestive system: Secondary | ICD-10-CM | POA: Insufficient documentation

## 2020-12-25 DIAGNOSIS — Z885 Allergy status to narcotic agent status: Secondary | ICD-10-CM | POA: Insufficient documentation

## 2020-12-25 DIAGNOSIS — Z886 Allergy status to analgesic agent status: Secondary | ICD-10-CM | POA: Insufficient documentation

## 2020-12-25 DIAGNOSIS — K635 Polyp of colon: Secondary | ICD-10-CM | POA: Diagnosis not present

## 2020-12-25 DIAGNOSIS — K5730 Diverticulosis of large intestine without perforation or abscess without bleeding: Secondary | ICD-10-CM | POA: Insufficient documentation

## 2020-12-25 DIAGNOSIS — Z79899 Other long term (current) drug therapy: Secondary | ICD-10-CM | POA: Diagnosis not present

## 2020-12-25 DIAGNOSIS — K649 Unspecified hemorrhoids: Secondary | ICD-10-CM | POA: Diagnosis not present

## 2020-12-25 DIAGNOSIS — Z888 Allergy status to other drugs, medicaments and biological substances status: Secondary | ICD-10-CM | POA: Insufficient documentation

## 2020-12-25 DIAGNOSIS — K625 Hemorrhage of anus and rectum: Secondary | ICD-10-CM | POA: Insufficient documentation

## 2020-12-25 DIAGNOSIS — Z880 Allergy status to penicillin: Secondary | ICD-10-CM | POA: Diagnosis not present

## 2020-12-25 DIAGNOSIS — K640 First degree hemorrhoids: Secondary | ICD-10-CM | POA: Insufficient documentation

## 2020-12-25 DIAGNOSIS — Z86010 Personal history of colonic polyps: Secondary | ICD-10-CM | POA: Diagnosis not present

## 2020-12-25 DIAGNOSIS — K5790 Diverticulosis of intestine, part unspecified, without perforation or abscess without bleeding: Secondary | ICD-10-CM | POA: Diagnosis not present

## 2020-12-25 DIAGNOSIS — Z1211 Encounter for screening for malignant neoplasm of colon: Secondary | ICD-10-CM | POA: Diagnosis not present

## 2020-12-25 HISTORY — DX: Essential (primary) hypertension: I10

## 2020-12-25 HISTORY — PX: COLONOSCOPY WITH PROPOFOL: SHX5780

## 2020-12-25 HISTORY — DX: Malignant (primary) neoplasm, unspecified: C80.1

## 2020-12-25 SURGERY — COLONOSCOPY WITH PROPOFOL
Anesthesia: General

## 2020-12-25 MED ORDER — PROPOFOL 500 MG/50ML IV EMUL
INTRAVENOUS | Status: DC | PRN
  Administered 2020-12-25: 150 ug/kg/min via INTRAVENOUS

## 2020-12-25 MED ORDER — PROPOFOL 500 MG/50ML IV EMUL
INTRAVENOUS | Status: AC
  Filled 2020-12-25: qty 50

## 2020-12-25 MED ORDER — SODIUM CHLORIDE 0.9 % IV SOLN
INTRAVENOUS | Status: DC
  Administered 2020-12-25: 1000 mL via INTRAVENOUS

## 2020-12-25 NOTE — Transfer of Care (Signed)
Immediate Anesthesia Transfer of Care Note  Patient: Sara Nguyen  Procedure(s) Performed: COLONOSCOPY WITH PROPOFOL  Patient Location: PACU  Anesthesia Type:General  Level of Consciousness: awake and sedated  Airway & Oxygen Therapy: Patient Spontanous Breathing and Patient connected to nasal cannula oxygen  Post-op Assessment: Report given to RN and Post -op Vital signs reviewed and stable  Post vital signs: Reviewed and stable  Last Vitals:  Vitals Value Taken Time  BP    Temp    Pulse    Resp    SpO2      Last Pain:  Vitals:   12/25/20 1150  TempSrc: Temporal  PainSc: 0-No pain         Complications: No notable events documented.

## 2020-12-25 NOTE — Anesthesia Postprocedure Evaluation (Signed)
Anesthesia Post Note  Patient: Sara Nguyen  Procedure(s) Performed: COLONOSCOPY WITH PROPOFOL  Patient location during evaluation: PACU Anesthesia Type: General Level of consciousness: awake and alert Pain management: pain level controlled Vital Signs Assessment: post-procedure vital signs reviewed and stable Respiratory status: spontaneous breathing, nonlabored ventilation and respiratory function stable Cardiovascular status: blood pressure returned to baseline and stable Postop Assessment: no apparent nausea or vomiting Anesthetic complications: no   No notable events documented.   Last Vitals:  Vitals:   12/25/20 1313 12/25/20 1323  BP: 120/69 135/71  Pulse: 80 73  Resp: 16 13  Temp:    SpO2: 98% 100%    Last Pain:  Vitals:   12/25/20 1323  TempSrc:   PainSc: 0-No pain                 Iran Ouch

## 2020-12-25 NOTE — Interval H&P Note (Signed)
History and Physical Interval Note: Preprocedure H&P from 12/25/20  was reviewed and there was no interval change after seeing and examining the patient.  Written consent was obtained from the patient after discussion of risks, benefits, and alternatives. Patient has consented to proceed with Colonoscopy with possible intervention  12/25/2020 12:23 PM  Sara Nguyen  has presented today for surgery, with the diagnosis of PRS HX COLON POLYPS.  The various methods of treatment have been discussed with the patient and family. After consideration of risks, benefits and other options for treatment, the patient has consented to  Procedure(s): COLONOSCOPY WITH PROPOFOL (N/A) as a surgical intervention.  The patient's history has been reviewed, patient examined, no change in status, stable for surgery.  I have reviewed the patient's chart and labs.  Questions were answered to the patient's satisfaction.     Annamaria Helling

## 2020-12-25 NOTE — Op Note (Addendum)
[facility] Gastroenterology Patient Name: Sara Nguyen Procedure Date: 12/25/2020 12:27 PM MRN: [id number] Account #: [id number] Date of Birth: Aug 19, 1945 Admit Type: Outpatient Age: 75 Room: [facility] ENDO ROOM 1 Gender: Female Note Status: Finalized Instrument Name: Colonscope [id number] Procedure:             Colonoscopy Indications:           High risk colon cancer surveillance: Personal history                         of colonic polyps Providers:             Rueben Bash, DO Referring MD:          Adrian Prows (Referring MD) Medicines:             Monitored Anesthesia Care Complications:         No immediate complications. Estimated blood loss:                         Minimal. Procedure:             Pre-Anesthesia Assessment:                        - Prior to the procedure, a History and Physical was                         performed, and patient medications and allergies were                         reviewed. The patient is competent. The risks and                         benefits of the procedure and the sedation options and                         risks were discussed with the patient. All questions                         were answered and informed consent was obtained.                         Patient identification and proposed procedure were                         verified by the physician, the nurse, the anesthetist                         and the technician in the endoscopy suite. Mental                         Status Examination: alert and oriented. Airway                         Examination: normal oropharyngeal airway and neck                         mobility. Respiratory Examination: clear to  auscultation. CV Examination: RRR, no murmurs, no S3                         or S4. Prophylactic Antibiotics: The patient does not                         require prophylactic antibiotics. Prior                          Anticoagulants: The patient has taken no previous                         anticoagulant or antiplatelet agents. ASA Grade                         Assessment: II - A patient with mild systemic disease.                         After reviewing the risks and benefits, the patient                         was deemed in satisfactory condition to undergo the                         procedure. The anesthesia plan was to use monitored                         anesthesia care (MAC). Immediately prior to                         administration of medications, the patient was                         re-assessed for adequacy to receive sedatives. The                         heart rate, respiratory rate, oxygen saturations,                         blood pressure, adequacy of pulmonary ventilation, and                         response to care were monitored throughout the                         procedure. The physical status of the patient was                         re-assessed after the procedure.                        After obtaining informed consent, the colonoscope was                         passed under direct vision. Throughout the procedure,                         the patient's blood pressure, pulse, and oxygen  saturations were monitored continuously. The                         Colonoscope was introduced through the anus and                         advanced to the the terminal ileum, with                         identification of the appendiceal orifice and IC                         valve. The colonoscopy was performed without                         difficulty. The patient tolerated the procedure well.                         The quality of the bowel preparation was evaluated                         using the BBPS [facility] Bowel Preparation Scale) with                         scores of: Right Colon = 3, Transverse Colon = 3 and                         Left Colon = 3  (entire mucosa seen well with no                         residual staining, small fragments of stool or opaque                         liquid). The total BBPS score equals 9. The terminal                         ileum, ileocecal valve, appendiceal orifice, and                         rectum were photographed. Findings:      The perianal and digital rectal examinations were normal. Pertinent       negatives include normal sphincter tone.      The terminal ileum appeared normal. Estimated blood loss: none.      A 2 to 3 mm polyp was found in the sigmoid colon. The polyp was sessile.       The polyp was removed with a cold biopsy forceps. Resection and       retrieval were complete. Estimated blood loss was minimal.      Multiple small-mouthed diverticula were found in the left colon.       Estimated blood loss: none.      Non-bleeding internal hemorrhoids were found during retroflexion. The       hemorrhoids were Grade I (internal hemorrhoids that do not prolapse).       Estimated blood loss: none.      The exam was otherwise without abnormality on direct and retroflexion       views. Impression:            -  The examined portion of the ileum was normal.                        - One 2 to 3 mm polyp in the sigmoid colon, removed                         with a cold biopsy forceps. Resected and retrieved.                        - Diverticulosis in the left colon.                        - Non-bleeding internal hemorrhoids.                        - The examination was otherwise normal on direct and                         retroflexion views. Recommendation:        - Discharge patient to home.                        - Resume previous diet.                        - Continue present medications.                        - Await pathology results.                        - Repeat colonoscopy in 7-10 years or discontinue                         screening due to age after informed discussion.                         - Return to referring physician as previously                         scheduled. Procedure Code(s):     --- Professional ---                        [phone], Colonoscopy, flexible; with biopsy, single or                         multiple Diagnosis Code(s):     --- Professional ---                        Z86.010, Personal history of colonic polyps                        K64.0, First degree hemorrhoids                        K63.5, Polyp of colon                        K57.30, Diverticulosis of large intestine without  perforation or abscess without bleeding CPT copyright 2019 American Medical Association. All rights reserved. The codes documented in this report are preliminary and upon coder review may  be revised to meet current compliance requirements. Attending Participation:      I personally performed the entire procedure. Volney American, DO Annamaria Helling DO, DO 12/25/2020 1:06:53 PM This report has been signed electronically. Number of Addenda: 0 Note Initiated On: 12/25/2020 12:27 PM Scope Withdrawal Time: 0 hours 11 minutes 36 seconds  Total Procedure Duration: 0 hours 21 minutes 56 seconds  Estimated Blood Loss:  Estimated blood loss was minimal.      [facility]

## 2020-12-25 NOTE — Anesthesia Procedure Notes (Signed)
Date/Time: 12/25/2020 12:29 PM Performed by: Vaughan Sine Pre-anesthesia Checklist: Patient identified, Emergency Drugs available, Suction available, Patient being monitored and Timeout performed Patient Re-evaluated:Patient Re-evaluated prior to induction Oxygen Delivery Method: Simple face mask Preoxygenation: Pre-oxygenation with 100% oxygen Induction Type: IV induction Placement Confirmation: positive ETCO2 and CO2 detector

## 2020-12-25 NOTE — H&P (Signed)
[facility] Pre-Procedure H&P   Patient ID: LIBBI RATHORE is a 75 y.o. female.  Gastroenterology Provider: Annamaria Helling, DO  Referring Provider: Laurine Blazer, PA PCP: Leonel Ramsay, MD  Date: 12/25/2020  HPI Ms. Sara Nguyen is a 75 y.o. female who presents today for Colonoscopy for personal history of colon polyps (surveillance) and bright red blood per rectum.  Rare brpr with wiping. Last colonoscopy 2018 [place]- polyp x2 unknown subtype; IH  Chronic nsaid use.  Patient denies nausea, vomiting, abdominal pain, diarrhea, constipation, melena, hematochezia, fever, chills, or weight loss  Past Medical History:  Diagnosis Date   Cancer ([place])    SKIN CANCER   Hypertension     Past Surgical History:  Procedure Laterality Date   ABDOMINAL HYSTERECTOMY     BREAST BIOPSY Right    unknown year- benign   BREAST CYST EXCISION Left    1965 or so- benign   HERNIA REPAIR     JOINT REPLACEMENT Left 12/2017   MOHS SURGERY Left 2020    Family History No h/o GI disease or malignancy  Review of Systems  Constitutional:  Negative for activity change, appetite change, fatigue, fever and unexpected weight change.  HENT:  Negative for trouble swallowing and voice change.   Respiratory:  Negative for shortness of breath and wheezing.   Cardiovascular:  Negative for chest pain and palpitations.  Gastrointestinal:  Positive for blood in stool (with wiping). Negative for abdominal distention, abdominal pain, anal bleeding, constipation, diarrhea, nausea, rectal pain and vomiting.  Musculoskeletal:  Positive for arthralgias. Negative for myalgias.  Skin:  Negative for color change and pallor.  Neurological:  Negative for dizziness, syncope and weakness.  Psychiatric/Behavioral:  Negative for agitation and confusion.   All other systems reviewed and are negative.   Medications No current facility-administered medications on file prior to  encounter.   Current Outpatient Medications on File Prior to Encounter  Medication Sig Dispense Refill   Ascorbic Acid (VITAMIN C) 1000 MG tablet Take 1,000 mg by mouth daily.     b complex vitamins capsule Take 1 capsule by mouth daily.     Calcium-Magnesium-Vitamin D (CALCIUM MAGNESIUM PO) Take by mouth 2 (two) times daily.     Cholecalciferol (VITAMIN D3) 1.25 MG (50000 UT) CAPS Take by mouth.     hydrochlorothiazide (HYDRODIURIL) 25 MG tablet Take 25 mg by mouth daily.     ibuprofen (ADVIL) 200 MG tablet Take 200-400 mg by mouth every 6 (six) hours as needed.     losartan (COZAAR) 25 MG tablet Take 25 mg by mouth daily.     MILK THISTLE PO Take 1,500 mg by mouth 2 (two) times daily.     Multiple Vitamin (MULTIVITAMIN) tablet Take by mouth daily.     aspirin EC 81 MG tablet Take 81 mg by mouth daily. Swallow whole.      Pertinent medications related to GI and procedure were reviewed by me with the patient prior to the procedure   Current Facility-Administered Medications:    0.9 %  sodium chloride infusion, , Intravenous, Continuous, Annamaria Helling, DO, Last Rate: 20 mL/hr at 12/25/20 1220, Continued from Pre-op at 12/25/20 1220  sodium chloride 20 mL/hr at 12/25/20 1220       Allergies  Allergen Reactions   Codeine    Morphine And Related    Other     GLUTEN PROTEIN   Penicillins    Tramadol Rash   Tylenol [Acetaminophen] Rash  Allergies were reviewed by me prior to the procedure  Objective    Vitals:   12/25/20 1150  BP: (!) 163/75  Pulse: (!) 104  Resp: 16  Temp: (!) 96.5 F (35.8 C)  TempSrc: Temporal  SpO2: 95%  Weight: 77.1 kg  Height: '5\' 7"'$  (1.702 m)    Physical Exam Vitals and nursing note reviewed.  Constitutional:      General: She is not in acute distress.    Appearance: Normal appearance. She is not ill-appearing, toxic-appearing or diaphoretic.  HENT:     Head: Normocephalic and atraumatic.     Nose: Nose normal.     Mouth/Throat:      Mouth: Mucous membranes are moist.     Pharynx: Oropharynx is clear.  Eyes:     General: No scleral icterus.    Extraocular Movements: Extraocular movements intact.  Cardiovascular:     Rate and Rhythm: Regular rhythm. Tachycardia present.     Heart sounds: Normal heart sounds. No murmur heard.   No friction rub. No gallop.  Pulmonary:     Effort: Pulmonary effort is normal. No respiratory distress.     Breath sounds: Normal breath sounds. No wheezing, rhonchi or rales.  Abdominal:     General: Bowel sounds are normal. There is no distension.     Palpations: Abdomen is soft.     Tenderness: There is no abdominal tenderness. There is no guarding or rebound.  Musculoskeletal:     Cervical back: Neck supple.     Right lower leg: No edema.     Left lower leg: No edema.  Skin:    General: Skin is warm and dry.     Coloration: Skin is not jaundiced or pale.  Neurological:     General: No focal deficit present.     Mental Status: She is alert and oriented to person, place, and time. Mental status is at baseline.  Psychiatric:        Mood and Affect: Mood normal.        Behavior: Behavior normal.        Thought Content: Thought content normal.        Judgment: Judgment normal.     Assessment:  Ms. Sara Nguyen is a 75 y.o. female  who presents today for Colonoscopy for personal history of colon polyps (surveillance) and bright red blood per rectum  Plan:  Colonoscopy with possible intervention today  Colonoscopy with possible biopsy, control of bleeding, polypectomy, and interventions as necessary has been discussed with the patient/patient representative. Informed consent was obtained from the patient/patient representative after explaining the indication, nature, and risks of the procedure including but not limited to death, bleeding, perforation, missed neoplasm/lesions, cardiorespiratory compromise, and reaction to medications. Opportunity for questions was given and  appropriate answers were provided. Patient/patient representative has verbalized understanding is amenable to undergoing the procedure.   Annamaria Helling, DO  [facility] Gastroenterology  Portions of the record may have been created with voice recognition software. Occasional wrong-word or 'sound-a-like' substitutions may have occurred due to the inherent limitations of voice recognition software.  Read the chart carefully and recognize, using context, where substitutions may have occurred.

## 2020-12-25 NOTE — Anesthesia Preprocedure Evaluation (Addendum)
Anesthesia Evaluation  Patient identified by MRN, date of birth, ID band Patient awake    Reviewed: Allergy & Precautions, NPO status , Patient's Chart, lab work & pertinent test results  Airway Mallampati: III  TM Distance: >3 FB Neck ROM: full    Dental  (+) Chipped   Pulmonary neg pulmonary ROS,    Pulmonary exam normal        Cardiovascular Exercise Tolerance: Good hypertension, Pt. on medications (-) angina (Past history of angina. Pt reports no recent episodes)Normal cardiovascular exam  Arteriosclerotic vascular disease   MPS 2020: CONCLUSIONS:  1. No significant symptoms with nonspecific ECG changes after pharmacologic stress.   2. The post stress ejection fraction is measured at 55 %.   3. There is an area of decreased activity in the inferoseptal left ventricle at rest and post stress with normal segmental contractility, suggestive of diaphragmatic attenuation.   4. There is an area of decreased activity in the anterior wall at rest and post stress with normal segmental contractility, suggestive of breast attenuation.   5. There is no ischemia     Neuro/Psych negative neurological ROS  negative psych ROS   GI/Hepatic negative GI ROS, Neg liver ROS,   Endo/Other  negative endocrine ROS  Renal/GU negative Renal ROS  negative genitourinary   Musculoskeletal   Abdominal   Peds  Hematology negative hematology ROS (+)   Anesthesia Other Findings Past Medical History: No date: Cancer ([place])     Comment:  SKIN CANCER No date: Hypertension  Past Surgical History: No date: ABDOMINAL HYSTERECTOMY No date: BREAST BIOPSY; Right     Comment:  unknown year- benign No date: BREAST CYST EXCISION; Left     Comment:  1965 or so- benign No date: HERNIA REPAIR 12/2017: JOINT REPLACEMENT; Left 2020: MOHS SURGERY; Left     Reproductive/Obstetrics negative OB ROS                            Anesthesia Physical Anesthesia Plan  ASA: 2  Anesthesia Plan: General   Post-op Pain Management:    Induction:   PONV Risk Score and Plan: Propofol infusion and TIVA  Airway Management Planned:   Additional Equipment:   Intra-op Plan:   Post-operative Plan:   Informed Consent: I have reviewed the patients History and Physical, chart, labs and discussed the procedure including the risks, benefits and alternatives for the proposed anesthesia with the patient or authorized representative who has indicated his/her understanding and acceptance.     Dental Advisory Given  Plan Discussed with: Anesthesiologist, CRNA and Surgeon  Anesthesia Plan Comments:         Anesthesia Quick Evaluation

## 2020-12-26 ENCOUNTER — Encounter: Payer: Self-pay | Admitting: Gastroenterology

## 2020-12-26 LAB — SURGICAL PATHOLOGY

## 2021-01-29 DIAGNOSIS — R309 Painful micturition, unspecified: Secondary | ICD-10-CM | POA: Diagnosis not present

## 2021-03-11 DIAGNOSIS — Z9889 Other specified postprocedural states: Secondary | ICD-10-CM | POA: Diagnosis not present

## 2021-03-11 DIAGNOSIS — I10 Essential (primary) hypertension: Secondary | ICD-10-CM | POA: Diagnosis not present

## 2021-03-11 DIAGNOSIS — M159 Polyosteoarthritis, unspecified: Secondary | ICD-10-CM | POA: Diagnosis not present

## 2021-03-17 DIAGNOSIS — R309 Painful micturition, unspecified: Secondary | ICD-10-CM | POA: Diagnosis not present

## 2021-03-20 DIAGNOSIS — Z79899 Other long term (current) drug therapy: Secondary | ICD-10-CM | POA: Diagnosis not present

## 2021-03-20 DIAGNOSIS — I10 Essential (primary) hypertension: Secondary | ICD-10-CM | POA: Diagnosis not present

## 2021-03-20 DIAGNOSIS — R5383 Other fatigue: Secondary | ICD-10-CM | POA: Diagnosis not present

## 2021-03-20 DIAGNOSIS — N390 Urinary tract infection, site not specified: Secondary | ICD-10-CM | POA: Diagnosis not present

## 2021-03-20 DIAGNOSIS — F32A Depression, unspecified: Secondary | ICD-10-CM | POA: Diagnosis not present

## 2021-04-02 ENCOUNTER — Ambulatory Visit: Payer: Medicare HMO | Admitting: Dermatology

## 2021-08-12 ENCOUNTER — Other Ambulatory Visit: Payer: Self-pay | Admitting: Student

## 2021-08-12 DIAGNOSIS — I208 Other forms of angina pectoris: Secondary | ICD-10-CM

## 2021-08-12 DIAGNOSIS — R079 Chest pain, unspecified: Secondary | ICD-10-CM

## 2021-08-13 ENCOUNTER — Other Ambulatory Visit (HOSPITAL_COMMUNITY): Payer: Self-pay | Admitting: Emergency Medicine

## 2021-08-13 DIAGNOSIS — R079 Chest pain, unspecified: Secondary | ICD-10-CM

## 2021-08-13 MED ORDER — METOPROLOL TARTRATE 100 MG PO TABS
100.0000 mg | ORAL_TABLET | Freq: Once | ORAL | 0 refills | Status: DC
Start: 2021-08-13 — End: 2022-12-21

## 2021-08-13 MED ORDER — IVABRADINE HCL 5 MG PO TABS
10.0000 mg | ORAL_TABLET | Freq: Once | ORAL | 0 refills | Status: AC
Start: 2021-08-13 — End: 2021-08-13

## 2021-08-14 ENCOUNTER — Telehealth (HOSPITAL_COMMUNITY): Payer: Self-pay | Admitting: Emergency Medicine

## 2021-08-14 NOTE — Telephone Encounter (Signed)
Reaching out to patient to offer assistance regarding upcoming cardiac imaging study; pt verbalizes understanding of appt date/time, parking situation and where to check in, pre-test NPO status and medications ordered, and verified current allergies; name and call back number provided for further questions should they arise ?Marchia Bond RN Navigator Cardiac Imaging ?[facility] ?[phone] office ?[phone] cell ? ?Denies iv issues ?'100mg'$  metoprolol + '10mg'$  ivabradine  ?Arrival 1215 ?

## 2021-08-17 ENCOUNTER — Ambulatory Visit
Admission: RE | Admit: 2021-08-17 | Discharge: 2021-08-17 | Disposition: A | Discharge status: Home / Self Care | Payer: Medicare HMO | Source: Ambulatory Visit | Attending: Student | Admitting: Student

## 2021-08-17 DIAGNOSIS — I208 Other forms of angina pectoris: Secondary | ICD-10-CM

## 2021-08-17 DIAGNOSIS — R079 Chest pain, unspecified: Secondary | ICD-10-CM | POA: Diagnosis not present

## 2021-08-17 LAB — POCT I-STAT CREATININE: Creatinine, Ser: 1 mg/dL (ref 0.44–1.00)

## 2021-08-17 MED ORDER — NITROGLYCERIN 0.4 MG SL SUBL
0.8000 mg | SUBLINGUAL_TABLET | Freq: Once | SUBLINGUAL | Status: AC
  Administered 2021-08-17: 0.8 mg via SUBLINGUAL

## 2021-08-17 MED ORDER — IOHEXOL 350 MG/ML SOLN
75.0000 mL | Freq: Once | INTRAVENOUS | Status: AC | PRN
  Administered 2021-08-17: 75 mL via INTRAVENOUS

## 2021-08-17 NOTE — Progress Notes (Signed)
Patient tolerated procedure well. Ambulate w/o difficulty. Denies light headedness or being dizzy. Sitting in chair drinking water provided. Encouraged to drink extra water today and reasoning explained. Verbalized understanding. All questions answered. ABC intact. No further needs. Discharge from procedure area w/o issues.   °

## 2021-09-29 ENCOUNTER — Other Ambulatory Visit: Payer: Self-pay | Admitting: Infectious Diseases

## 2021-09-29 DIAGNOSIS — Z1231 Encounter for screening mammogram for malignant neoplasm of breast: Secondary | ICD-10-CM

## 2021-10-23 ENCOUNTER — Ambulatory Visit
Admission: RE | Admit: 2021-10-23 | Discharge: 2021-10-23 | Disposition: A | Discharge status: Home / Self Care | Payer: Medicare HMO | Source: Ambulatory Visit | Attending: Infectious Diseases | Admitting: Infectious Diseases

## 2021-10-23 DIAGNOSIS — Z1231 Encounter for screening mammogram for malignant neoplasm of breast: Secondary | ICD-10-CM | POA: Diagnosis not present

## 2021-10-26 DIAGNOSIS — M159 Polyosteoarthritis, unspecified: Secondary | ICD-10-CM | POA: Diagnosis not present

## 2021-10-26 DIAGNOSIS — F330 Major depressive disorder, recurrent, mild: Secondary | ICD-10-CM | POA: Diagnosis not present

## 2021-10-26 DIAGNOSIS — R5383 Other fatigue: Secondary | ICD-10-CM | POA: Diagnosis not present

## 2021-10-26 DIAGNOSIS — I10 Essential (primary) hypertension: Secondary | ICD-10-CM | POA: Diagnosis not present

## 2021-11-02 DIAGNOSIS — D649 Anemia, unspecified: Secondary | ICD-10-CM | POA: Diagnosis not present

## 2021-11-02 DIAGNOSIS — I10 Essential (primary) hypertension: Secondary | ICD-10-CM | POA: Diagnosis not present

## 2021-11-02 DIAGNOSIS — Z79899 Other long term (current) drug therapy: Secondary | ICD-10-CM | POA: Diagnosis not present

## 2021-11-02 DIAGNOSIS — F411 Generalized anxiety disorder: Secondary | ICD-10-CM | POA: Diagnosis not present

## 2021-11-02 DIAGNOSIS — R7989 Other specified abnormal findings of blood chemistry: Secondary | ICD-10-CM | POA: Diagnosis not present

## 2021-11-02 DIAGNOSIS — F330 Major depressive disorder, recurrent, mild: Secondary | ICD-10-CM | POA: Diagnosis not present

## 2021-11-02 DIAGNOSIS — R5383 Other fatigue: Secondary | ICD-10-CM | POA: Diagnosis not present

## 2021-11-02 DIAGNOSIS — F32A Depression, unspecified: Secondary | ICD-10-CM | POA: Diagnosis not present

## 2021-11-24 DIAGNOSIS — Z961 Presence of intraocular lens: Secondary | ICD-10-CM | POA: Diagnosis not present

## 2021-11-24 DIAGNOSIS — H524 Presbyopia: Secondary | ICD-10-CM | POA: Diagnosis not present

## 2021-12-16 DIAGNOSIS — M25432 Effusion, left wrist: Secondary | ICD-10-CM | POA: Insufficient documentation

## 2021-12-16 DIAGNOSIS — M25532 Pain in left wrist: Secondary | ICD-10-CM | POA: Insufficient documentation

## 2021-12-16 DIAGNOSIS — M189 Osteoarthritis of first carpometacarpal joint, unspecified: Secondary | ICD-10-CM | POA: Insufficient documentation

## 2021-12-16 DIAGNOSIS — M19032 Primary osteoarthritis, left wrist: Secondary | ICD-10-CM

## 2021-12-16 DIAGNOSIS — M1812 Unilateral primary osteoarthritis of first carpometacarpal joint, left hand: Secondary | ICD-10-CM | POA: Diagnosis not present

## 2021-12-16 HISTORY — DX: Pain in left wrist: M25.532

## 2021-12-16 HISTORY — DX: Effusion, left wrist: M25.432

## 2021-12-16 HISTORY — DX: Osteoarthritis of first carpometacarpal joint, unspecified: M18.9

## 2021-12-16 HISTORY — DX: Primary osteoarthritis, left wrist: M19.032

## 2021-12-28 DIAGNOSIS — Z8679 Personal history of other diseases of the circulatory system: Secondary | ICD-10-CM | POA: Diagnosis not present

## 2021-12-28 DIAGNOSIS — R079 Chest pain, unspecified: Secondary | ICD-10-CM | POA: Diagnosis not present

## 2021-12-28 DIAGNOSIS — I2510 Atherosclerotic heart disease of native coronary artery without angina pectoris: Secondary | ICD-10-CM | POA: Diagnosis not present

## 2021-12-28 DIAGNOSIS — I10 Essential (primary) hypertension: Secondary | ICD-10-CM | POA: Diagnosis not present

## 2021-12-28 DIAGNOSIS — R5383 Other fatigue: Secondary | ICD-10-CM | POA: Diagnosis not present

## 2021-12-29 DIAGNOSIS — M1812 Unilateral primary osteoarthritis of first carpometacarpal joint, left hand: Secondary | ICD-10-CM | POA: Diagnosis not present

## 2021-12-29 DIAGNOSIS — M189 Osteoarthritis of first carpometacarpal joint, unspecified: Secondary | ICD-10-CM | POA: Diagnosis not present

## 2022-02-02 DIAGNOSIS — M2042 Other hammer toe(s) (acquired), left foot: Secondary | ICD-10-CM | POA: Diagnosis not present

## 2022-02-02 DIAGNOSIS — R918 Other nonspecific abnormal finding of lung field: Secondary | ICD-10-CM | POA: Diagnosis not present

## 2022-02-02 DIAGNOSIS — M79672 Pain in left foot: Secondary | ICD-10-CM | POA: Diagnosis not present

## 2022-02-02 DIAGNOSIS — K760 Fatty (change of) liver, not elsewhere classified: Secondary | ICD-10-CM | POA: Diagnosis not present

## 2022-02-02 DIAGNOSIS — F320 Major depressive disorder, single episode, mild: Secondary | ICD-10-CM | POA: Diagnosis not present

## 2022-02-02 DIAGNOSIS — I10 Essential (primary) hypertension: Secondary | ICD-10-CM | POA: Diagnosis not present

## 2022-02-02 DIAGNOSIS — R5383 Other fatigue: Secondary | ICD-10-CM | POA: Diagnosis not present

## 2022-02-04 ENCOUNTER — Other Ambulatory Visit: Payer: Self-pay | Admitting: Infectious Diseases

## 2022-02-04 DIAGNOSIS — R911 Solitary pulmonary nodule: Secondary | ICD-10-CM

## 2022-02-12 ENCOUNTER — Ambulatory Visit
Admission: RE | Admit: 2022-02-12 | Discharge: 2022-02-12 | Disposition: A | Discharge status: Home / Self Care | Payer: Medicare HMO | Source: Ambulatory Visit | Attending: Infectious Diseases | Admitting: Infectious Diseases

## 2022-02-12 DIAGNOSIS — I700 Atherosclerosis of aorta: Secondary | ICD-10-CM | POA: Diagnosis not present

## 2022-02-12 DIAGNOSIS — R911 Solitary pulmonary nodule: Secondary | ICD-10-CM | POA: Diagnosis not present

## 2022-02-23 DIAGNOSIS — R1084 Generalized abdominal pain: Secondary | ICD-10-CM | POA: Diagnosis not present

## 2022-02-23 DIAGNOSIS — R079 Chest pain, unspecified: Secondary | ICD-10-CM | POA: Diagnosis not present

## 2022-02-23 DIAGNOSIS — I2089 Other forms of angina pectoris: Secondary | ICD-10-CM | POA: Diagnosis not present

## 2022-02-23 DIAGNOSIS — I2510 Atherosclerotic heart disease of native coronary artery without angina pectoris: Secondary | ICD-10-CM | POA: Diagnosis not present

## 2022-02-23 DIAGNOSIS — W010XXA Fall on same level from slipping, tripping and stumbling without subsequent striking against object, initial encounter: Secondary | ICD-10-CM | POA: Diagnosis not present

## 2022-02-23 DIAGNOSIS — Z8679 Personal history of other diseases of the circulatory system: Secondary | ICD-10-CM | POA: Diagnosis not present

## 2022-02-23 DIAGNOSIS — I499 Cardiac arrhythmia, unspecified: Secondary | ICD-10-CM | POA: Diagnosis not present

## 2022-02-23 DIAGNOSIS — R5383 Other fatigue: Secondary | ICD-10-CM | POA: Diagnosis not present

## 2022-02-23 DIAGNOSIS — I10 Essential (primary) hypertension: Secondary | ICD-10-CM | POA: Diagnosis not present

## 2022-03-09 DIAGNOSIS — M2042 Other hammer toe(s) (acquired), left foot: Secondary | ICD-10-CM | POA: Diagnosis not present

## 2022-03-09 DIAGNOSIS — M79672 Pain in left foot: Secondary | ICD-10-CM | POA: Diagnosis not present

## 2022-03-10 ENCOUNTER — Other Ambulatory Visit: Payer: Self-pay | Admitting: Podiatry

## 2022-03-17 NOTE — Discharge Instructions (Signed)
[facility]  POST OPERATIVE INSTRUCTIONS FOR DR. FOWLER AND [facility] PODIATRY DEPARTMENT   Take your medication as prescribed.  Pain medication should be taken only as needed.  Keep the dressing clean, dry and intact.  Keep your foot elevated above the heart level for the first 48 hours.  Walking to the bathroom and brief periods of walking are acceptable, unless we have instructed you to be non-weight bearing.  Always wear your post-op shoe when walking.  Always use your crutches if you are to be non-weight bearing.  Do not take a shower. Baths are permissible as long as the foot is kept out of the water.   Every hour you are awake:  Bend your knee 15 times. Flex foot 15 times Massage calf 15 times  Call [facility] ([phone]) if any of the following problems occur: You develop a temperature or fever. The bandage becomes saturated with blood. Medication does not stop your pain. Injury of the foot occurs. Any symptoms of infection including redness, odor, or red streaks running from wound. 

## 2022-03-18 ENCOUNTER — Encounter: Payer: Self-pay | Admitting: Podiatry

## 2022-03-18 ENCOUNTER — Other Ambulatory Visit: Payer: Self-pay

## 2022-03-22 NOTE — Anesthesia Preprocedure Evaluation (Signed)
Anesthesia Evaluation  Patient identified by MRN, date of birth, ID band Patient awake    Reviewed: Allergy & Precautions, NPO status , Patient's Chart, lab work & pertinent test results  History of Anesthesia Complications Negative for: history of anesthetic complications  Airway Mallampati: III   Neck ROM: Full    Dental  (+) Missing Crowns :   Pulmonary neg pulmonary ROS   Pulmonary exam normal breath sounds clear to auscultation       Cardiovascular hypertension, + CAD and + Peripheral Vascular Disease  Normal cardiovascular exam Rhythm:Regular Rate:Normal  Echo 08/26/21:  NORMAL LEFT VENTRICULAR SYSTOLIC FUNCTION NORMAL RIGHT VENTRICULAR SYSTOLIC FUNCTION NO VALVULAR STENOSIS TRIVIAL MR MILD TR EF 50%   Myocardial perfusion 10/18/18:  1. No significant symptoms with nonspecific ECG changes after pharmacologic stress.  2. The post stress ejection fraction is measured at 55 %.  3. There is an area of decreased activity in the inferoseptal left ventricle at rest and post stress with normal segmental contractility, suggestive of diaphragmatic attenuation.  4. There is an area of decreased activity in the anterior wall at rest and post stress with normal segmental contractility, suggestive of breast attenuation.  5. There is no ischemia    Neuro/Psych negative neurological ROS     GI/Hepatic negative GI ROS,,,  Endo/Other  negative endocrine ROS    Renal/GU negative Renal ROS     Musculoskeletal  (+) Arthritis ,  Skin CA   Abdominal   Peds  Hematology negative hematology ROS (+)   Anesthesia Other Findings Cardiology note 02/23/22:  76 y.o. female with   1. Coronary artery disease involving native coronary artery of native heart without angina pectoris  2. Intermittent chest pain  3. Other fatigue  4. History of cardiac arrhythmia  5. Primary hypertension   Plan   CAD, chronic, stable  Intermittent  chest pain, resolved, the coronary CTA from 08/17/2021 revealed mild, nonobstructive CAD estimated at (25-49%).  Fatigue, chronic, fairly stable. Likely secondary to the aging process and poor physical conditioning. Echocardiogram from 08/26/2021 revealed normal LV systolic function with mild TR and estimated EF of 50%. Recommend lifestyle changes with increasing physical activity as above. Cardiac arrhythmia by history, stable. ECG on 08/12/2021 showed normal sinus rhythm with sinus arrhythmia sinus arrhythmia as above. Essential hypertension, chronic, well controlled. BP today is Continue losartan and hydrochlorothiazide therapy. Have patient follow up as needed  Return if symptoms worsen or fail to improve.    Reproductive/Obstetrics                             Anesthesia Physical Anesthesia Plan  ASA: 2  Anesthesia Plan: General   Post-op Pain Management:    Induction: Intravenous  PONV Risk Score and Plan: 3 and Propofol infusion, TIVA and Treatment may vary due to age or medical condition  Airway Management Planned: Natural Airway  Additional Equipment:   Intra-op Plan:   Post-operative Plan:   Informed Consent: I have reviewed the patients History and Physical, chart, labs and discussed the procedure including the risks, benefits and alternatives for the proposed anesthesia with the patient or authorized representative who has indicated his/her understanding and acceptance.       Plan Discussed with: CRNA  Anesthesia Plan Comments: (LMA/GETA backup discussed.  Patient consented for risks of anesthesia including but not limited to:  - adverse reactions to medications - damage to eyes, teeth, lips or other oral mucosa -  nerve damage due to positioning  - sore throat or hoarseness - damage to heart, brain, nerves, lungs, other parts of body or loss of life  Informed patient about role of CRNA in peri- and intra-operative care.  Patient voiced  understanding.)        Anesthesia Quick Evaluation

## 2022-03-24 ENCOUNTER — Ambulatory Visit: Payer: Medicare HMO | Admitting: Anesthesiology

## 2022-03-24 ENCOUNTER — Other Ambulatory Visit: Payer: Self-pay

## 2022-03-24 ENCOUNTER — Encounter: Payer: Self-pay | Admitting: Podiatry

## 2022-03-24 ENCOUNTER — Encounter
Admission: RE | Disposition: A | Discharge status: Home / Self Care | Payer: Self-pay | Source: Home / Self Care | Attending: Podiatry

## 2022-03-24 ENCOUNTER — Ambulatory Visit: Payer: Self-pay

## 2022-03-24 ENCOUNTER — Ambulatory Visit
Admission: RE | Admit: 2022-03-24 | Discharge: 2022-03-24 | Disposition: A | Discharge status: Home / Self Care | Payer: Medicare HMO | Attending: Podiatry | Admitting: Podiatry

## 2022-03-24 DIAGNOSIS — I10 Essential (primary) hypertension: Secondary | ICD-10-CM | POA: Diagnosis not present

## 2022-03-24 DIAGNOSIS — I2510 Atherosclerotic heart disease of native coronary artery without angina pectoris: Secondary | ICD-10-CM | POA: Insufficient documentation

## 2022-03-24 DIAGNOSIS — M2042 Other hammer toe(s) (acquired), left foot: Secondary | ICD-10-CM | POA: Insufficient documentation

## 2022-03-24 DIAGNOSIS — M898X7 Other specified disorders of bone, ankle and foot: Secondary | ICD-10-CM | POA: Diagnosis not present

## 2022-03-24 DIAGNOSIS — M899 Disorder of bone, unspecified: Secondary | ICD-10-CM | POA: Insufficient documentation

## 2022-03-24 DIAGNOSIS — I739 Peripheral vascular disease, unspecified: Secondary | ICD-10-CM | POA: Insufficient documentation

## 2022-03-24 HISTORY — PX: EXCISION PARTIAL PHALANX: SHX6617

## 2022-03-24 HISTORY — PX: HAMMER TOE SURGERY: SHX385

## 2022-03-24 HISTORY — DX: Unspecified osteoarthritis, unspecified site: M19.90

## 2022-03-24 HISTORY — DX: Peripheral vascular disease, unspecified: I73.9

## 2022-03-24 SURGERY — EXCISION, PHALANX, PARTIAL
Anesthesia: General | Site: Toe | Laterality: Left

## 2022-03-24 MED ORDER — POVIDONE-IODINE 10 % EX SWAB: 2.0000 | Freq: Once | CUTANEOUS | Status: DC

## 2022-03-24 MED ORDER — BUPIVACAINE HCL (PF) 0.25 % IJ SOLN
INTRAMUSCULAR | Status: DC | PRN
  Administered 2022-03-24: 4.5 mL

## 2022-03-24 MED ORDER — ONDANSETRON HCL 4 MG/2ML IJ SOLN
INTRAMUSCULAR | Status: DC | PRN
  Administered 2022-03-24: 4 mg via INTRAVENOUS

## 2022-03-24 MED ORDER — OXYCODONE HCL 5 MG PO TABS: 5.0000 mg | ORAL_TABLET | Freq: Three times a day (TID) | ORAL | 0 refills | Status: DC | PRN

## 2022-03-24 MED ORDER — FENTANYL CITRATE (PF) 100 MCG/2ML IJ SOLN
INTRAMUSCULAR | Status: DC | PRN
  Administered 2022-03-24 (×2): 50 ug via INTRAVENOUS

## 2022-03-24 MED ORDER — LACTATED RINGERS IV SOLN: INTRAVENOUS | Status: DC

## 2022-03-24 MED ORDER — MIDAZOLAM HCL 2 MG/2ML IJ SOLN: 2.0000 mg | Freq: Once | INTRAMUSCULAR | Status: DC

## 2022-03-24 MED ORDER — LIDOCAINE-EPINEPHRINE 1 %-1:100000 IJ SOLN
INTRAMUSCULAR | Status: DC | PRN
  Administered 2022-03-24: 4.5 mL

## 2022-03-24 MED ORDER — PROPOFOL 500 MG/50ML IV EMUL
INTRAVENOUS | Status: DC | PRN
  Administered 2022-03-24: 50 mg via INTRAVENOUS
  Administered 2022-03-24: 100 ug/kg/min via INTRAVENOUS

## 2022-03-24 MED ORDER — VANCOMYCIN HCL IN DEXTROSE 1-5 GM/200ML-% IV SOLN
1000.0000 mg | INTRAVENOUS | Status: AC
  Administered 2022-03-24: 1000 mg via INTRAVENOUS

## 2022-03-24 SURGICAL SUPPLY — 30 items
BLADE OSC/SAGITTAL MD 5.5X18 (BLADE) IMPLANT
BLADE SURG 15 STRL LF DISP TIS (BLADE) IMPLANT
BLADE SURG 15 STRL SS (BLADE) ×2
BNDG CMPR 75X41 PLY HI ABS (GAUZE/BANDAGES/DRESSINGS) ×2
BNDG ESMARK 4X12 TAN STRL LF (GAUZE/BANDAGES/DRESSINGS) ×2 IMPLANT
BNDG STRETCH 4X75 STRL LF (GAUZE/BANDAGES/DRESSINGS) ×2 IMPLANT
BUR FISSURE CARBIDE (BURR) IMPLANT
CANISTER SUCT 1200ML W/VALVE (MISCELLANEOUS) ×2 IMPLANT
COVER LIGHT HANDLE UNIVERSAL (MISCELLANEOUS) ×4 IMPLANT
CUFF TOURN SGL QUICK 18X4 (TOURNIQUET CUFF) IMPLANT
DRAPE FLUOR MINI C-ARM 54X84 (DRAPES) ×2 IMPLANT
DURAPREP 26ML APPLICATOR (WOUND CARE) ×2 IMPLANT
ELECT REM PT RETURN 9FT ADLT (ELECTROSURGICAL) ×2
ELECTRODE REM PT RTRN 9FT ADLT (ELECTROSURGICAL) ×2 IMPLANT
GAUZE SPONGE 4X4 12PLY STRL (GAUZE/BANDAGES/DRESSINGS) ×2 IMPLANT
GAUZE XEROFORM 1X8 LF (GAUZE/BANDAGES/DRESSINGS) ×2 IMPLANT
GLOVE SRG 8 PF TXTR STRL LF DI (GLOVE) ×2 IMPLANT
GLOVE SURG ENC MOIS LTX SZ7.5 (GLOVE) ×2 IMPLANT
GLOVE SURG UNDER POLY LF SZ8 (GLOVE) ×2
GOWN STRL REUS W/ TWL LRG LVL3 (GOWN DISPOSABLE) ×4 IMPLANT
GOWN STRL REUS W/TWL LRG LVL3 (GOWN DISPOSABLE) ×4
KIT TURNOVER KIT A (KITS) ×2 IMPLANT
NS IRRIG 500ML POUR BTL (IV SOLUTION) ×2 IMPLANT
PACK EXTREMITY ARMC (MISCELLANEOUS) ×2 IMPLANT
STOCKINETTE IMPERVIOUS LG (DRAPES) ×2 IMPLANT
SUT ETHILON 4-0 (SUTURE) ×4
SUT ETHILON 4-0 FS2 18XMFL BLK (SUTURE) ×4
SUT VIC AB 4-0 SH 27 (SUTURE) ×2
SUT VIC AB 4-0 SH 27XANBCTRL (SUTURE) IMPLANT
SUTURE ETHLN 4-0 FS2 18XMF BLK (SUTURE) IMPLANT

## 2022-03-24 NOTE — Anesthesia Postprocedure Evaluation (Signed)
Anesthesia Post Note  Patient: Sara Nguyen  Procedure(s) Performed: 11031 - EXCISION PARTIAL PHALANX - FOURTH (Left: Foot) HAMMER TOE CORRECTION - FIFTH (Left: Toe)  Patient location during evaluation: PACU Anesthesia Type: General Level of consciousness: awake and alert, oriented and patient cooperative Pain management: pain level controlled Vital Signs Assessment: post-procedure vital signs reviewed and stable Respiratory status: spontaneous breathing, nonlabored ventilation and respiratory function stable Cardiovascular status: blood pressure returned to baseline and stable Postop Assessment: adequate PO intake Anesthetic complications: no   No notable events documented.   Last Vitals:  Vitals:   03/24/22 1242 03/24/22 1451  BP: (!) 173/84 138/71  Pulse: 70 87  Resp: 18 19  Temp: (!) 36.2 C (!) 36.2 C  SpO2: 100% 98%    Last Pain:  Vitals:   03/24/22 1451  TempSrc:   PainSc: 0-No pain                 Darrin Nipper

## 2022-03-24 NOTE — Op Note (Signed)
Operative note   Surgeon:Abbigayle Toole Lawyer: None    Preop diagnosis: 1.  Hammertoe left fifth toe 2.  Exostosis left fourth toe    Postop diagnosis: Same    Procedure: 1.  Derotational arthroplasty left fifth toe 2.  Exostectomy PIPJ left fourth toe 3.  Intraoperative fluoroscopy use left foot    EBL: Minimal    Anesthesia:local and IV sedation.  Lidocaine with epinephrine was infiltrated along the incision sites dorsally.  The circumferential area around the toe was infiltrated with 0.25% bupivacaine    Hemostasis: Lidocaine with epinephrine infiltrated along the incision site.    Specimen: None    Complications: None    Operative indications:Sara Nguyen is an 76 y.o. that presents today for surgical intervention.  The risks/benefits/alternatives/complications have been discussed and consent has been given.    Procedure:  Patient was brought into the OR and placed on the operating table in thesupine position. After anesthesia was obtained theleft lower extremity was prepped and draped in usual sterile fashion.  Attention was directed to the left fifth toe where 2 semielliptical incisions were made from dorsal distal to proximal plantar.  Sharp and blunt dissection carried down to the tendon.  The ellipse of skin was removed.  The PIPJ was then exposed with a extensor tenotomy.  The head of the proximal phalanx was then excised with a power saw.  The toe was held in a derotated position.  The wound was flushed with copious amounts of irrigation.  The extensor tendon was reapproximated with a 4-0 Vicryl.  The skin was reapproximated with a 4-0 nylon.  Attention was then directed to the PIPJ of the left fourth toe laterally.  Sharp and blunt dissection carried down to the capsular region.  The capsule was entered and the lateral aspect of the head of the proximal phalanx was exposed.  At this time with a 1.6 mm sidecutting bur I was able to remove a prominent exostosis on  the lateral aspect of the proximal phalangeal joint.  Good removal and decompression was noted.  The overlying hyperkeratotic tissue at this level was then excised and debrided away.  The wound was flushed with copious amounts of irrigation.  Subcutaneous tissue was closed with a 4-0 Vicryl and the skin closed with a 4-0 nylon.  Pre and post surgical x-rays revealed the reduction of the head of the proximal phalanx as well as a derotation of the fifth toe and removal of the lateral condyle of the PIPJ of the fourth toe.  A bulky sterile dressing was then applied to the left foot.    Patient tolerated the procedure and anesthesia well.  Was transported from the OR to the PACU with all vital signs stable and vascular status intact. To be discharged per routine protocol.  Will follow up in approximately 1 week in the outpatient clinic.  A prescription for Roxicodone without Tylenol was sent to the pharmacy Tylenol was listed as an allergy.

## 2022-03-24 NOTE — Transfer of Care (Signed)
Immediate Anesthesia Transfer of Care Note  Patient: Sara Nguyen  Procedure(s) Performed: 57846 - EXCISION PARTIAL PHALANX - FOURTH (Left: Foot) HAMMER TOE CORRECTION - FIFTH (Left: Toe)  Patient Location: PACU  Anesthesia Type: MAC  Level of Consciousness: awake, alert  and patient cooperative  Airway and Oxygen Therapy: Patient Spontanous Breathing and Patient connected to supplemental oxygen  Post-op Assessment: Post-op Vital signs reviewed, Patient's Cardiovascular Status Stable, Respiratory Function Stable, Patent Airway and No signs of Nausea or vomiting  Post-op Vital Signs: Reviewed and stable  Complications: No notable events documented.

## 2022-03-24 NOTE — H&P (Signed)
HISTORY AND PHYSICAL INTERVAL NOTE:  03/24/2022  1:27 PM  Sara Nguyen  has presented today for surgery, with the diagnosis of M79.672 - Acute foot pain, left M20.42 - Hammer toe of left foot.  The various methods of treatment have been discussed with the patient.  No guarantees were given.  After consideration of risks, benefits and other options for treatment, the patient has consented to surgery.  I have reviewed the patients' chart and labs.     A history and physical examination was performed in my office.  The patient was reexamined.  There have been no changes to this history and physical examination.  Samara Deist A

## 2022-03-25 ENCOUNTER — Encounter: Payer: Self-pay | Admitting: Podiatry

## 2022-03-30 DIAGNOSIS — M2042 Other hammer toe(s) (acquired), left foot: Secondary | ICD-10-CM | POA: Diagnosis not present

## 2022-03-30 DIAGNOSIS — M79672 Pain in left foot: Secondary | ICD-10-CM | POA: Diagnosis not present

## 2022-04-18 IMAGING — MG MM DIGITAL SCREENING BILAT W/ TOMO AND CAD
8 series · 8 of 24 positions shown · non-contrast
Comparison: Previous exam(s).

CLINICAL DATA: Screening.

EXAM:
DIGITAL SCREENING BILATERAL MAMMOGRAM WITH TOMOSYNTHESIS AND CAD
TECHNIQUE: Bilateral screening digital craniocaudal and mediolateral oblique
mammograms were obtained. Bilateral screening digital breast
tomosynthesis was performed. The images were evaluated with
computer-aided detection.

[R CC synth-2D]
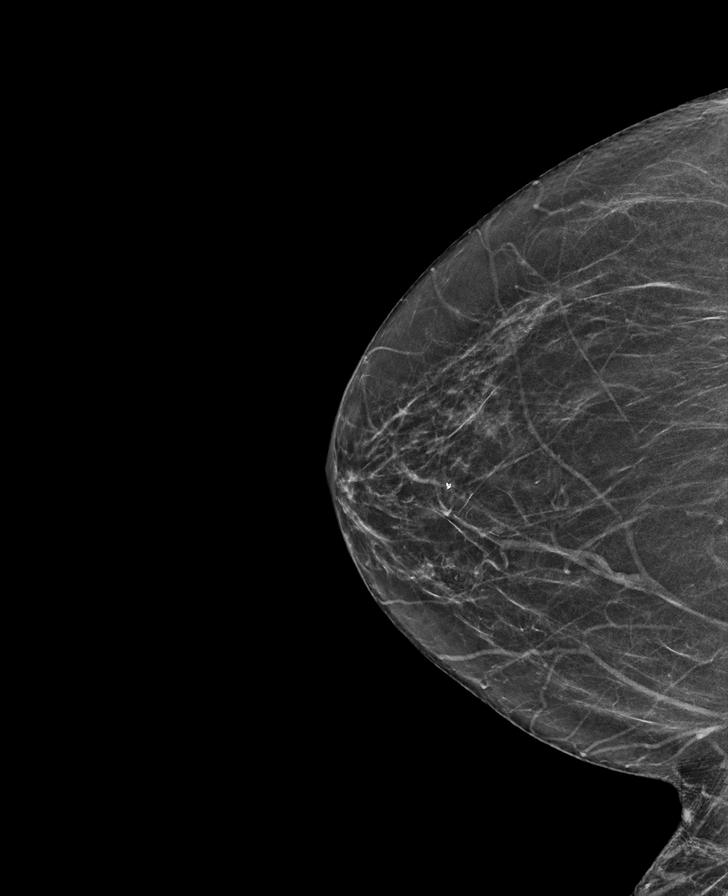

[L CC synth-2D]
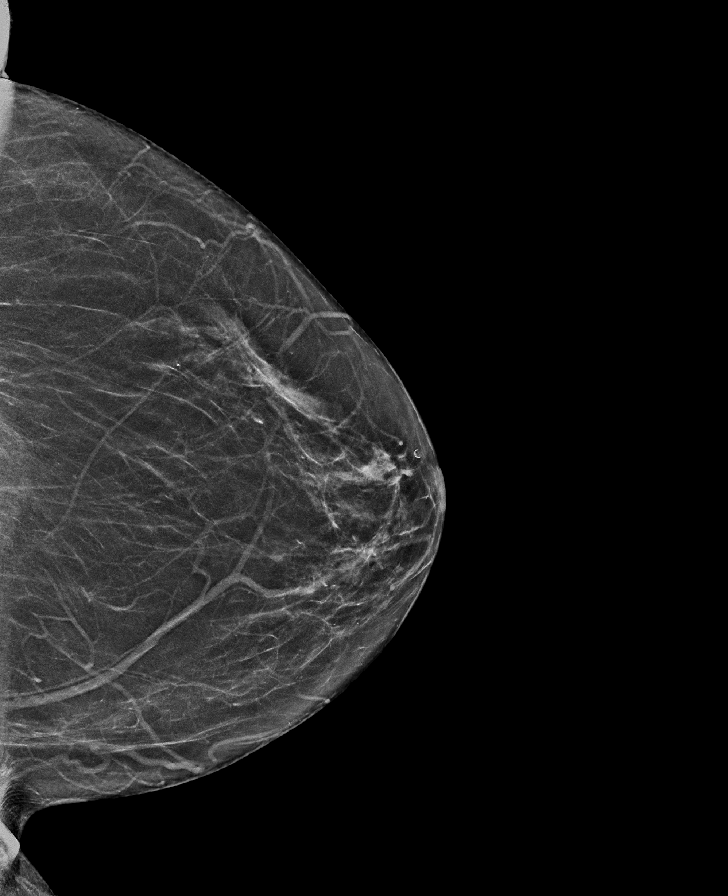

[L MLO synth-2D]
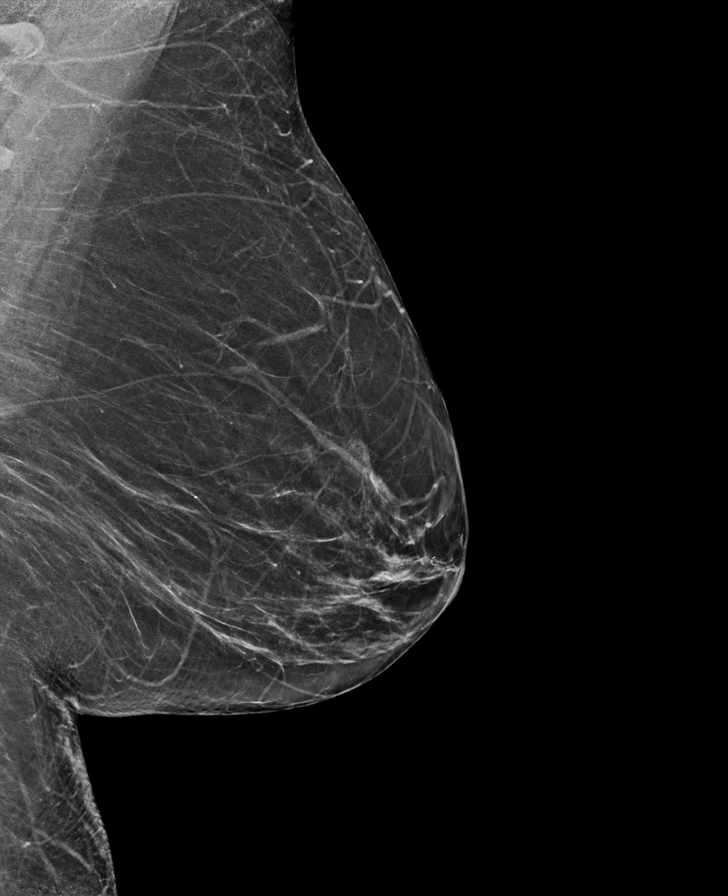

[R MLO synth-2D]
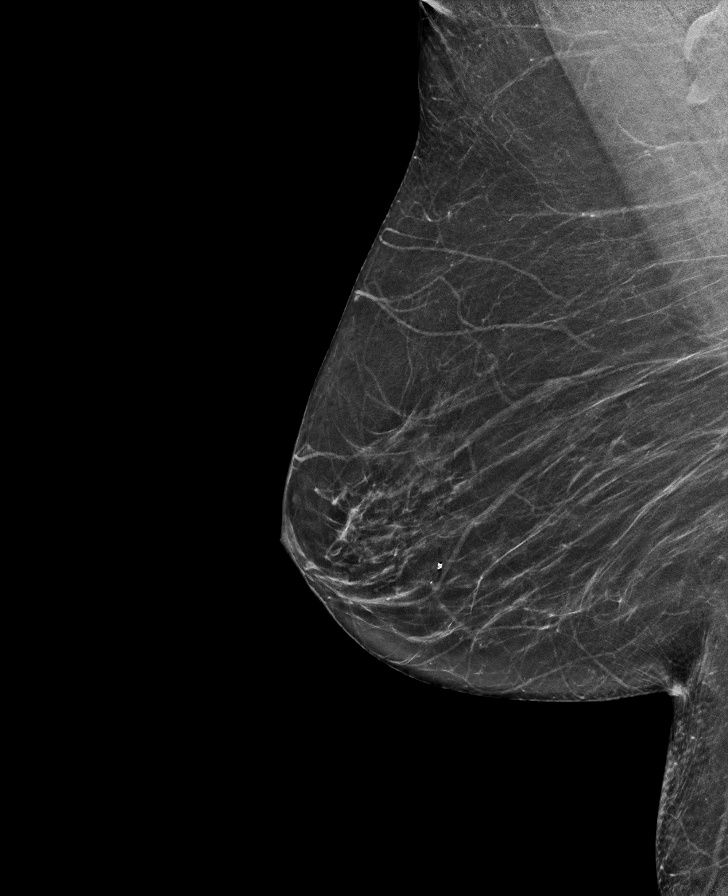

[L MLO tomo · tomo slice 31/60.0]
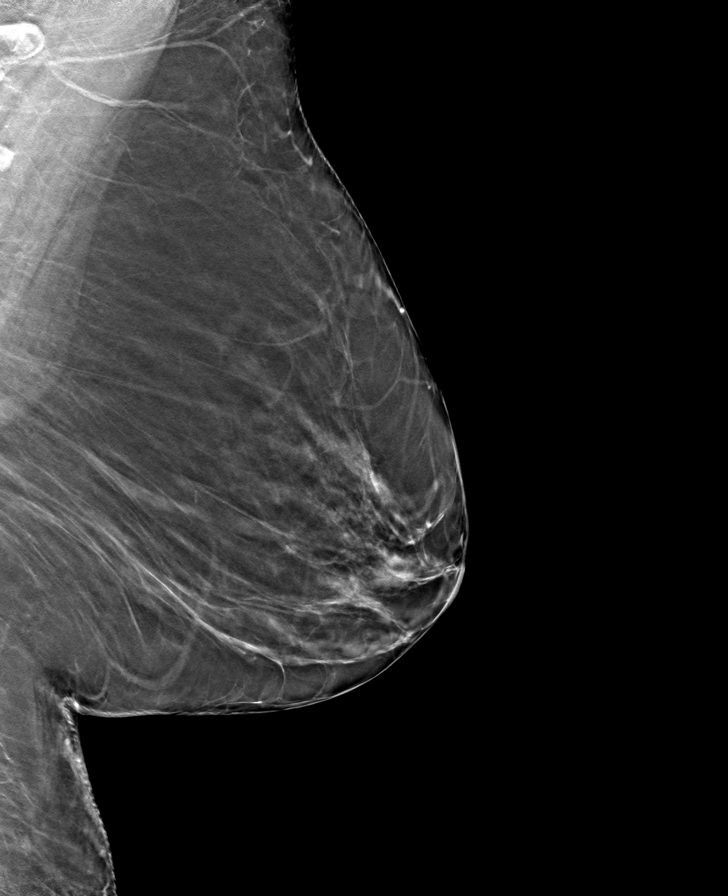

[R MLO tomo · tomo slice 29/57.0]
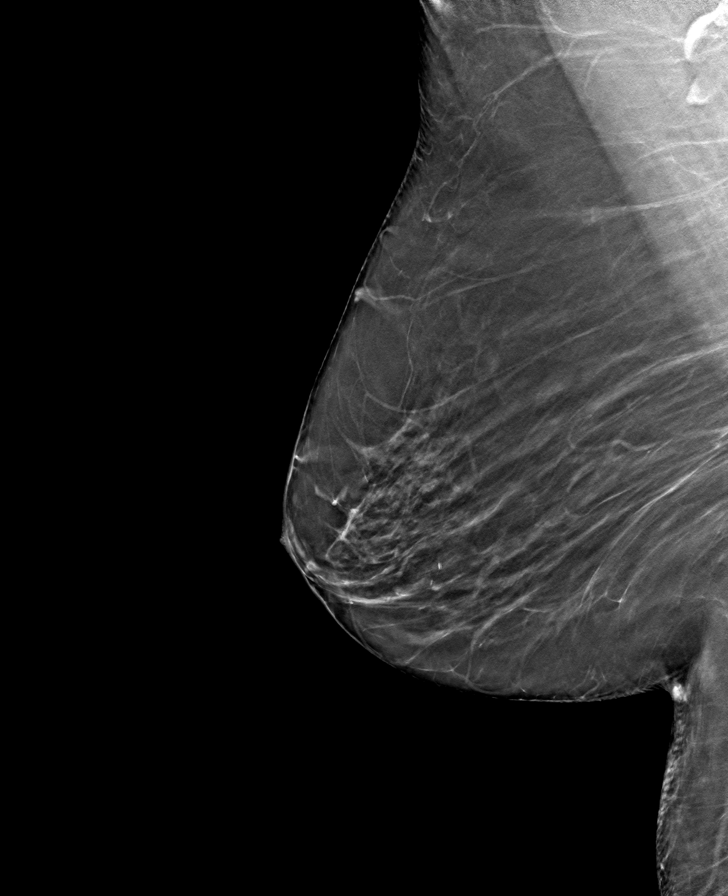

[L CC tomo · tomo slice 29/56.0]
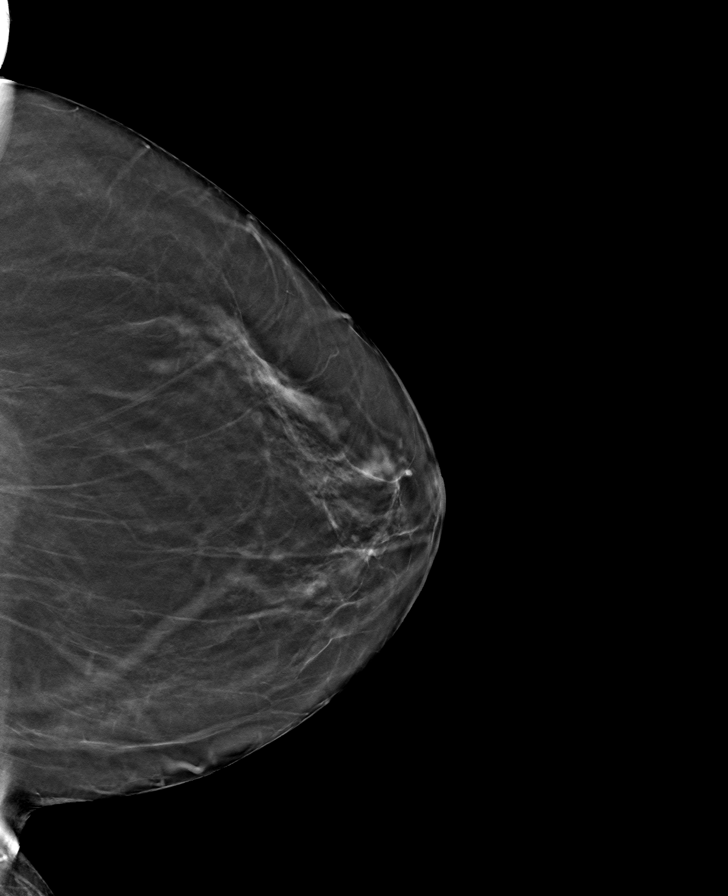

[R CC tomo · tomo slice 26/51.0]
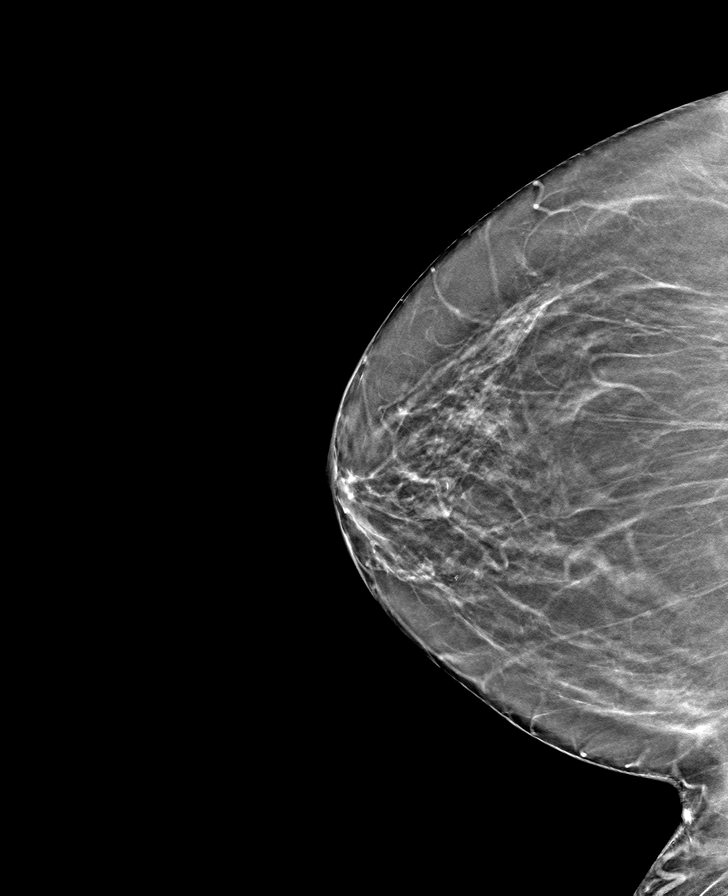

[8 of 24 positions shown; findings below may reference images not displayed]

ACR Breast Density Category b: There are scattered areas of
fibroglandular density.
FINDINGS: There are no findings suspicious for malignancy. The images were
evaluated with computer-aided detection.
IMPRESSION: No mammographic evidence of malignancy. A result letter of this
screening mammogram will be mailed directly to the patient.

RECOMMENDATION:
Screening mammogram in one year. (Code:[id number])

BI-RADS CATEGORY  1: Negative.

## 2022-05-10 DIAGNOSIS — S50811A Abrasion of right forearm, initial encounter: Secondary | ICD-10-CM | POA: Diagnosis not present

## 2022-05-10 DIAGNOSIS — W5501XA Bitten by cat, initial encounter: Secondary | ICD-10-CM | POA: Diagnosis not present

## 2022-05-10 DIAGNOSIS — W5503XA Scratched by cat, initial encounter: Secondary | ICD-10-CM | POA: Diagnosis not present

## 2022-05-10 DIAGNOSIS — S40811A Abrasion of right upper arm, initial encounter: Secondary | ICD-10-CM | POA: Diagnosis not present

## 2022-05-10 DIAGNOSIS — S61511A Laceration without foreign body of right wrist, initial encounter: Secondary | ICD-10-CM | POA: Diagnosis not present

## 2022-05-13 DIAGNOSIS — M2042 Other hammer toe(s) (acquired), left foot: Secondary | ICD-10-CM | POA: Diagnosis not present

## 2022-05-13 DIAGNOSIS — M79672 Pain in left foot: Secondary | ICD-10-CM | POA: Diagnosis not present

## 2022-05-20 DEATH — deceased

## 2022-06-01 DIAGNOSIS — I10 Essential (primary) hypertension: Secondary | ICD-10-CM | POA: Diagnosis not present

## 2022-06-01 DIAGNOSIS — R739 Hyperglycemia, unspecified: Secondary | ICD-10-CM | POA: Diagnosis not present

## 2022-06-01 DIAGNOSIS — K760 Fatty (change of) liver, not elsewhere classified: Secondary | ICD-10-CM | POA: Diagnosis not present

## 2022-06-01 DIAGNOSIS — R5383 Other fatigue: Secondary | ICD-10-CM | POA: Diagnosis not present

## 2022-06-01 DIAGNOSIS — F411 Generalized anxiety disorder: Secondary | ICD-10-CM | POA: Diagnosis not present

## 2022-06-01 DIAGNOSIS — R918 Other nonspecific abnormal finding of lung field: Secondary | ICD-10-CM | POA: Diagnosis not present

## 2022-06-07 DIAGNOSIS — Z0000 Encounter for general adult medical examination without abnormal findings: Secondary | ICD-10-CM | POA: Diagnosis not present

## 2022-06-07 DIAGNOSIS — F330 Major depressive disorder, recurrent, mild: Secondary | ICD-10-CM | POA: Diagnosis not present

## 2022-06-07 DIAGNOSIS — K760 Fatty (change of) liver, not elsewhere classified: Secondary | ICD-10-CM | POA: Diagnosis not present

## 2022-06-07 DIAGNOSIS — I10 Essential (primary) hypertension: Secondary | ICD-10-CM | POA: Diagnosis not present

## 2022-07-13 DIAGNOSIS — Z8679 Personal history of other diseases of the circulatory system: Secondary | ICD-10-CM | POA: Diagnosis not present

## 2022-07-13 DIAGNOSIS — I499 Cardiac arrhythmia, unspecified: Secondary | ICD-10-CM | POA: Diagnosis not present

## 2022-07-13 DIAGNOSIS — R5383 Other fatigue: Secondary | ICD-10-CM | POA: Diagnosis not present

## 2022-07-13 DIAGNOSIS — I2510 Atherosclerotic heart disease of native coronary artery without angina pectoris: Secondary | ICD-10-CM | POA: Diagnosis not present

## 2022-07-13 DIAGNOSIS — I10 Essential (primary) hypertension: Secondary | ICD-10-CM | POA: Diagnosis not present

## 2022-07-14 DIAGNOSIS — I10 Essential (primary) hypertension: Secondary | ICD-10-CM | POA: Diagnosis not present

## 2022-07-14 DIAGNOSIS — R7303 Prediabetes: Secondary | ICD-10-CM | POA: Insufficient documentation

## 2022-07-14 DIAGNOSIS — F320 Major depressive disorder, single episode, mild: Secondary | ICD-10-CM | POA: Diagnosis not present

## 2022-07-14 HISTORY — DX: Prediabetes: R73.03

## 2022-07-28 DIAGNOSIS — F330 Major depressive disorder, recurrent, mild: Secondary | ICD-10-CM | POA: Diagnosis not present

## 2022-07-28 DIAGNOSIS — I10 Essential (primary) hypertension: Secondary | ICD-10-CM | POA: Diagnosis not present

## 2022-07-28 DIAGNOSIS — F411 Generalized anxiety disorder: Secondary | ICD-10-CM | POA: Diagnosis not present

## 2022-09-16 DIAGNOSIS — M47896 Other spondylosis, lumbar region: Secondary | ICD-10-CM | POA: Diagnosis not present

## 2022-09-16 DIAGNOSIS — M47816 Spondylosis without myelopathy or radiculopathy, lumbar region: Secondary | ICD-10-CM

## 2022-09-16 HISTORY — DX: Spondylosis without myelopathy or radiculopathy, lumbar region: M47.816

## 2022-09-29 DIAGNOSIS — M7061 Trochanteric bursitis, right hip: Secondary | ICD-10-CM | POA: Diagnosis not present

## 2022-09-29 DIAGNOSIS — M1611 Unilateral primary osteoarthritis, right hip: Secondary | ICD-10-CM | POA: Diagnosis not present

## 2022-10-19 DIAGNOSIS — M25551 Pain in right hip: Secondary | ICD-10-CM | POA: Diagnosis not present

## 2022-10-19 DIAGNOSIS — M1611 Unilateral primary osteoarthritis, right hip: Secondary | ICD-10-CM | POA: Diagnosis not present

## 2022-11-03 DIAGNOSIS — M25551 Pain in right hip: Secondary | ICD-10-CM | POA: Insufficient documentation

## 2022-11-03 DIAGNOSIS — M1611 Unilateral primary osteoarthritis, right hip: Secondary | ICD-10-CM

## 2022-11-03 HISTORY — DX: Unilateral primary osteoarthritis, right hip: M16.11

## 2022-11-03 HISTORY — DX: Pain in right hip: M25.551

## 2022-11-09 DIAGNOSIS — M25551 Pain in right hip: Secondary | ICD-10-CM | POA: Diagnosis not present

## 2022-11-09 DIAGNOSIS — M1611 Unilateral primary osteoarthritis, right hip: Secondary | ICD-10-CM | POA: Diagnosis not present

## 2022-11-11 ENCOUNTER — Telehealth: Payer: Self-pay

## 2022-11-11 DIAGNOSIS — M25551 Pain in right hip: Secondary | ICD-10-CM | POA: Diagnosis not present

## 2022-11-11 DIAGNOSIS — M1611 Unilateral primary osteoarthritis, right hip: Secondary | ICD-10-CM | POA: Diagnosis not present

## 2022-11-11 NOTE — Telephone Encounter (Signed)
Patient called in to check on her referral and she wanted to schedule and appointment. I checked for the patient referral and notice that she is established patient with [facility]. I informed her that I would have to talk to the PA and doctors for their approval. I asked why she wanted to transfer she stated that she wanted a female doctor and some who will under her.

## 2022-11-12 DIAGNOSIS — M25551 Pain in right hip: Secondary | ICD-10-CM | POA: Diagnosis not present

## 2022-11-12 DIAGNOSIS — M1611 Unilateral primary osteoarthritis, right hip: Secondary | ICD-10-CM | POA: Diagnosis not present

## 2022-11-12 DIAGNOSIS — M25511 Pain in right shoulder: Secondary | ICD-10-CM | POA: Diagnosis not present

## 2022-11-12 DIAGNOSIS — M25552 Pain in left hip: Secondary | ICD-10-CM | POA: Diagnosis not present

## 2022-11-12 DIAGNOSIS — M7061 Trochanteric bursitis, right hip: Secondary | ICD-10-CM | POA: Diagnosis not present

## 2022-11-16 DIAGNOSIS — M25551 Pain in right hip: Secondary | ICD-10-CM | POA: Diagnosis not present

## 2022-11-16 DIAGNOSIS — M1611 Unilateral primary osteoarthritis, right hip: Secondary | ICD-10-CM | POA: Diagnosis not present

## 2022-11-17 DIAGNOSIS — M1611 Unilateral primary osteoarthritis, right hip: Secondary | ICD-10-CM | POA: Diagnosis not present

## 2022-11-17 DIAGNOSIS — M25551 Pain in right hip: Secondary | ICD-10-CM | POA: Diagnosis not present

## 2022-11-24 DIAGNOSIS — I2510 Atherosclerotic heart disease of native coronary artery without angina pectoris: Secondary | ICD-10-CM | POA: Diagnosis not present

## 2022-11-24 DIAGNOSIS — G4733 Obstructive sleep apnea (adult) (pediatric): Secondary | ICD-10-CM | POA: Diagnosis not present

## 2022-11-24 DIAGNOSIS — R9439 Abnormal result of other cardiovascular function study: Secondary | ICD-10-CM | POA: Diagnosis not present

## 2022-11-24 DIAGNOSIS — I499 Cardiac arrhythmia, unspecified: Secondary | ICD-10-CM | POA: Diagnosis not present

## 2022-11-24 DIAGNOSIS — R5383 Other fatigue: Secondary | ICD-10-CM | POA: Diagnosis not present

## 2022-11-24 DIAGNOSIS — I10 Essential (primary) hypertension: Secondary | ICD-10-CM | POA: Diagnosis not present

## 2022-11-24 DIAGNOSIS — Z8679 Personal history of other diseases of the circulatory system: Secondary | ICD-10-CM | POA: Diagnosis not present

## 2022-11-25 DIAGNOSIS — M1611 Unilateral primary osteoarthritis, right hip: Secondary | ICD-10-CM | POA: Diagnosis not present

## 2022-11-25 DIAGNOSIS — M25551 Pain in right hip: Secondary | ICD-10-CM | POA: Diagnosis not present

## 2022-11-26 DIAGNOSIS — M25551 Pain in right hip: Secondary | ICD-10-CM | POA: Diagnosis not present

## 2022-11-26 DIAGNOSIS — M1611 Unilateral primary osteoarthritis, right hip: Secondary | ICD-10-CM | POA: Diagnosis not present

## 2022-11-30 DIAGNOSIS — M1611 Unilateral primary osteoarthritis, right hip: Secondary | ICD-10-CM | POA: Diagnosis not present

## 2022-11-30 DIAGNOSIS — M25551 Pain in right hip: Secondary | ICD-10-CM | POA: Diagnosis not present

## 2022-12-02 DIAGNOSIS — M25551 Pain in right hip: Secondary | ICD-10-CM | POA: Diagnosis not present

## 2022-12-02 DIAGNOSIS — M1611 Unilateral primary osteoarthritis, right hip: Secondary | ICD-10-CM | POA: Diagnosis not present

## 2022-12-03 DIAGNOSIS — M25511 Pain in right shoulder: Secondary | ICD-10-CM | POA: Diagnosis not present

## 2022-12-06 DIAGNOSIS — M25551 Pain in right hip: Secondary | ICD-10-CM | POA: Diagnosis not present

## 2022-12-06 DIAGNOSIS — M1611 Unilateral primary osteoarthritis, right hip: Secondary | ICD-10-CM | POA: Diagnosis not present

## 2022-12-08 DIAGNOSIS — R9439 Abnormal result of other cardiovascular function study: Secondary | ICD-10-CM | POA: Diagnosis not present

## 2022-12-08 DIAGNOSIS — I2089 Other forms of angina pectoris: Secondary | ICD-10-CM | POA: Diagnosis not present

## 2022-12-13 DIAGNOSIS — M25551 Pain in right hip: Secondary | ICD-10-CM | POA: Diagnosis not present

## 2022-12-13 DIAGNOSIS — M1611 Unilateral primary osteoarthritis, right hip: Secondary | ICD-10-CM | POA: Diagnosis not present

## 2022-12-15 DIAGNOSIS — M1611 Unilateral primary osteoarthritis, right hip: Secondary | ICD-10-CM | POA: Diagnosis not present

## 2022-12-15 DIAGNOSIS — M25551 Pain in right hip: Secondary | ICD-10-CM | POA: Diagnosis not present

## 2022-12-15 DIAGNOSIS — M25511 Pain in right shoulder: Secondary | ICD-10-CM | POA: Insufficient documentation

## 2022-12-15 HISTORY — DX: Pain in right shoulder: M25.511

## 2022-12-21 ENCOUNTER — Encounter: Payer: Self-pay | Admitting: Physician Assistant

## 2022-12-21 ENCOUNTER — Ambulatory Visit (INDEPENDENT_AMBULATORY_CARE_PROVIDER_SITE_OTHER): Payer: Medicare HMO | Admitting: Physician Assistant

## 2022-12-21 ENCOUNTER — Other Ambulatory Visit: Payer: Self-pay

## 2022-12-21 VITALS — BP 136/81 | HR 89 | Temp 97.9°F | Ht 67.0 in | Wt 191.4 lb

## 2022-12-21 DIAGNOSIS — K649 Unspecified hemorrhoids: Secondary | ICD-10-CM

## 2022-12-21 DIAGNOSIS — K625 Hemorrhage of anus and rectum: Secondary | ICD-10-CM

## 2022-12-21 MED ORDER — HYDROCORTISONE ACETATE 25 MG RE SUPP: 25.0000 mg | Freq: Two times a day (BID) | RECTAL | 0 refills | Status: DC

## 2022-12-21 NOTE — Progress Notes (Signed)
Sara Amy, PA-C [address]  [address]  [place], [place] [address]  Main: [phone]  Fax: [phone]   Gastroenterology Consultation  Referring Provider:     Mick Sell, MD Primary Care Physician:  Mick Sell, MD Primary Gastroenterologist:  Sara Amy, PA-C / Dr. Lannette Donath   Reason for Consultation:     Rectal bleeding and hemorrhoids        HPI:   Sara Nguyen is a 77 y.o. y/o female is self referred for consultation & management  by Mick Sell, MD.    Patient saw [facility], [place]) for office visit 08/2020.  History of occasional rectal bleeding with wiping attributed to hemorrhoids, ongoing for many years.  She is requesting to transfer GI care to our office.  Last colonoscopy done by Dr. Timothy Lasso 12/2020 showed 1 small 2 to 3 mm colon mucosal polyp removed from sigmoid colon, diverticulosis, and grade 1 internal hemorrhoids.  Reportedly had previous colon polyps removed during colonoscopy in 2018 and 2010.  GI Symptoms: She continues to have episodes of bright red blood on the tissue in the toilet after bowel movements.  Last episode of rectal bleeding occurred 4 weeks ago.  She reports having history of hemorrhoids for many years.  No previous surgery or banding.  She has used topical creams with little benefit.  Typically has a bowel movement daily which are sometimes irregular.  Has occasional straining.  Usually spends average of 5 minutes on the toilet.  Occasional episode of leaking or soiling of stool.  She notices more rectal bleeding after she takes ibuprofen or Celebrex for arthritis pain.  Past Medical History:  Diagnosis Date   Arthritis    all over knees and hip   Cancer (HCC)    SKIN CANCER   Hypertension    Peripheral vascular disease (HCC)     Past Surgical History:  Procedure Laterality Date   ABDOMINAL HYSTERECTOMY     BREAST BIOPSY Right    unknown year- benign    BREAST CYST EXCISION Left    1965 or so- benign   COLONOSCOPY WITH PROPOFOL N/A 12/25/2020   Procedure: COLONOSCOPY WITH PROPOFOL;  Surgeon: Jaynie Collins, DO;  Location: [facility] ENDOSCOPY;  Service: Endoscopy;  Laterality: N/A;   EXCISION PARTIAL PHALANX Left 03/24/2022   Procedure: 28124 - EXCISION PARTIAL PHALANX - FOURTH;  Surgeon: Gwyneth Revels, DPM;  Location: [facility];  Service: Podiatry;  Laterality: Left;   HAMMER TOE SURGERY Left 03/24/2022   Procedure: HAMMER TOE CORRECTION - FIFTH;  Surgeon: Gwyneth Revels, DPM;  Location: [facility];  Service: Podiatry;  Laterality: Left;   HERNIA REPAIR     JOINT REPLACEMENT Bilateral 12/2017   2 knees and left hip   MOHS SURGERY Left 2020    Prior to Admission medications   Medication Sig Start Date End Date Taking? Authorizing Provider  Ascorbic Acid (VITAMIN C) 1000 MG tablet Take 1,000 mg by mouth daily. Patient not taking: Reported on 03/18/2022    [provider]  aspirin EC 81 MG tablet Take 81 mg by mouth daily. Swallow whole. Patient not taking: Reported on 03/18/2022    [provider]  b complex vitamins capsule Take 1 capsule by mouth daily.    [provider]  Calcium-Magnesium-Vitamin D (CALCIUM MAGNESIUM PO) Take by mouth 2 (two) times daily.    [provider]  Cholecalciferol (VITAMIN D3) 1.25 MG (50000 UT)  CAPS Take by mouth.    [provider]  hydrochlorothiazide (HYDRODIURIL) 25 MG tablet Take 25 mg by mouth daily. Patient not taking: Reported on 03/18/2022    [provider]  ibuprofen (ADVIL) 200 MG tablet Take 200-400 mg by mouth every 6 (six) hours as needed.    [provider]  losartan (COZAAR) 25 MG tablet Take 50 mg by mouth daily.    [provider]  metoprolol tartrate (LOPRESSOR) 100 MG tablet Take 1 tablet (100 mg total) by mouth once for 1 dose. Please take one time dose 100mg  metoprolol tartrate 2 hr prior to  cardiac CT for HR control IF HR >55bpm. Patient not taking: Reported on 03/24/2022 08/13/21 08/13/21  Debbe Odea, MD  MILK THISTLE PO Take 1,500 mg by mouth 2 (two) times daily.    [provider]  Multiple Vitamin (MULTIVITAMIN) tablet Take by mouth daily.    [provider]  oxyCODONE (ROXICODONE) 5 MG immediate release tablet Take 1 tablet (5 mg total) by mouth every 8 (eight) hours as needed. 03/24/22 03/24/23  Gwyneth Revels, DPM    Family History  Problem Relation Age of Onset   Breast cancer Cousin      Social History   Tobacco Use   Smoking status: Never   Smokeless tobacco: Never  Vaping Use   Vaping status: Never Used  Substance Use Topics   Alcohol use: Yes    Alcohol/week: 3.0 standard drinks of alcohol    Types: 3 Glasses of wine per week    Comment: down to 2-3 times a week   Drug use: Not Currently    Types: Marijuana, Cocaine    Allergies as of 12/21/2022 - Review Complete 12/21/2022  Allergen Reaction Noted   Codeine Other (See Comments) 09/02/2020   Gluten meal Other (See Comments) 09/04/2019   Morphine and codeine  09/02/2020   Other  12/24/2020   Penicillins  09/02/2020   Tramadol Rash 09/02/2020   Tylenol [acetaminophen] Rash 09/02/2020    Review of Systems:    All systems reviewed and negative except where noted in HPI.   Physical Exam:  BP 136/81 (BP Location: Left Arm, Patient Position: Sitting, Cuff Size: Large)   Pulse 89   Temp 97.9 F (36.6 C) (Oral)   Ht 5\' 7"  (1.702 m)   Wt 191 lb 6 oz (86.8 kg)   BMI 29.97 kg/m  No LMP recorded. Patient has had a hysterectomy.  General:   Alert,  Well-developed, well-nourished, pleasant and cooperative in NAD Lungs:  Respirations even and unlabored.  Clear throughout to auscultation.   No wheezes, crackles, or rhonchi. No acute distress. Heart:  Regular rate and rhythm; no murmurs, clicks, rubs, or gallops. Abdomen:  Normal bowel sounds.  No bruits.  Soft, and non-distended  without masses, hepatosplenomegaly or hernias noted.  No Tenderness.  No guarding or rebound tenderness.    Rectal Exam: Small external hemorrhoid skin tags; Anoscope exam showed moderate swollen purple internal hemorrhoids which are not bleeding.  No rectal masses or tenderness.  Imaging Studies: See HPI.  Assessment and Plan:   Sara Nguyen is a 77 y.o. y/o female has been referred for rectal bleeding and internal hemorrhoids.  Chronic for many years.  Last colonoscopy 12/2020 showed grade 1 internal hemorrhoids, and 1 benign mucosal polyp removed.  Internal hemorrhoids  Discussed treatment for hemorrhoids at length.  For External Hemorrhoids: Warm water sitz bath with epsom salt for flare up of external hemorrhoids. Use  OTC Preparation H, Tucks Pads, and Witch Hazel wipes as needed. Discussed referral for Surgery as a last resort if Conservative treatment fails.  For Internal Hemorrhoids: Rx Hydrocortisone Suppositories 25mg  Insert 1 into rectum once daily at bedtime for 12 days - Rx sent. Stressed importance of treating underlying constipation. Avoid Sitting on the toilet for prolonged amount of time. Discussed Internal Hemorrhoid Banding if no improvement with conservative treament.   Rectal Bleeding -attributed to hemorrhoids Lab: CBC, screen for anemia  Note: Patient prefers a female provider.  If patient requires internal hemorrhoid banding in the future, then we will schedule with Dr. Allegra Lai.  Follow up in 4-6 weeks with TG.  Sara Amy, PA-C

## 2022-12-21 NOTE — Patient Instructions (Signed)
For External Hemorrhoids: Warm water sitz bath with epsom salt for flare up of external hemorrhoids. Use OTC Preparation H, Tucks Pads, and Witch Hazel wipes as needed. Discussed referral for Surgery as a last resort if Conservative treatment fails.  For Internal Hemorrhoids: Rx Hydrocortisone Suppositories 25mg  Insert 1 into rectum once daily at bedtime for 12 days. Stressed importance of treating underlying constipation. Avoid Sitting on the toilet for prolonged amount of time. Discussed Internal Hemorrhoid Banding if no improvement with conservative treament.

## 2022-12-22 LAB — CBC
Hematocrit: 39.4 % (ref 34.0–46.6)
Hemoglobin: 13.5 g/dL (ref 11.1–15.9)
MCH: 34.2 pg — ABNORMAL HIGH (ref 26.6–33.0)
MCHC: 34.3 g/dL (ref 31.5–35.7)
MCV: 100 fL — ABNORMAL HIGH (ref 79–97)
Platelets: 283 10*3/uL (ref 150–450)
RBC: 3.95 x10E6/uL (ref 3.77–5.28)
RDW: 12.2 % (ref 11.7–15.4)
WBC: 5.2 10*3/uL (ref 3.4–10.8)

## 2022-12-23 DIAGNOSIS — M1611 Unilateral primary osteoarthritis, right hip: Secondary | ICD-10-CM | POA: Diagnosis not present

## 2022-12-23 DIAGNOSIS — M25551 Pain in right hip: Secondary | ICD-10-CM | POA: Diagnosis not present

## 2022-12-24 DIAGNOSIS — I10 Essential (primary) hypertension: Secondary | ICD-10-CM | POA: Diagnosis not present

## 2022-12-24 DIAGNOSIS — Z8739 Personal history of other diseases of the musculoskeletal system and connective tissue: Secondary | ICD-10-CM | POA: Diagnosis not present

## 2022-12-24 DIAGNOSIS — D7589 Other specified diseases of blood and blood-forming organs: Secondary | ICD-10-CM | POA: Diagnosis not present

## 2022-12-24 DIAGNOSIS — Z79899 Other long term (current) drug therapy: Secondary | ICD-10-CM | POA: Diagnosis not present

## 2022-12-24 DIAGNOSIS — R7303 Prediabetes: Secondary | ICD-10-CM | POA: Diagnosis not present

## 2023-01-03 ENCOUNTER — Other Ambulatory Visit: Payer: Self-pay | Admitting: Infectious Diseases

## 2023-01-03 DIAGNOSIS — Z1231 Encounter for screening mammogram for malignant neoplasm of breast: Secondary | ICD-10-CM

## 2023-01-11 ENCOUNTER — Encounter: Payer: Self-pay | Admitting: Infectious Diseases

## 2023-01-11 ENCOUNTER — Ambulatory Visit
Admission: RE | Admit: 2023-01-11 | Discharge: 2023-01-11 | Disposition: A | Discharge status: Home / Self Care | Payer: Medicare HMO | Source: Ambulatory Visit | Attending: Infectious Diseases | Admitting: Infectious Diseases

## 2023-01-11 ENCOUNTER — Ambulatory Visit: Payer: Medicare HMO | Admitting: Physician Assistant

## 2023-01-11 DIAGNOSIS — Z1231 Encounter for screening mammogram for malignant neoplasm of breast: Secondary | ICD-10-CM | POA: Insufficient documentation

## 2023-01-13 ENCOUNTER — Other Ambulatory Visit: Payer: Self-pay | Admitting: Infectious Diseases

## 2023-01-13 DIAGNOSIS — N644 Mastodynia: Secondary | ICD-10-CM

## 2023-01-14 ENCOUNTER — Other Ambulatory Visit: Payer: Self-pay | Admitting: Infectious Diseases

## 2023-01-14 ENCOUNTER — Ambulatory Visit
Admission: RE | Admit: 2023-01-14 | Discharge: 2023-01-14 | Disposition: A | Discharge status: Home / Self Care | Payer: Medicare HMO | Source: Ambulatory Visit | Attending: Infectious Diseases | Admitting: Infectious Diseases

## 2023-01-14 DIAGNOSIS — M7061 Trochanteric bursitis, right hip: Secondary | ICD-10-CM | POA: Diagnosis not present

## 2023-01-14 DIAGNOSIS — N644 Mastodynia: Secondary | ICD-10-CM | POA: Insufficient documentation

## 2023-01-14 DIAGNOSIS — M25511 Pain in right shoulder: Secondary | ICD-10-CM | POA: Diagnosis not present

## 2023-01-14 DIAGNOSIS — M25552 Pain in left hip: Secondary | ICD-10-CM | POA: Diagnosis not present

## 2023-01-14 DIAGNOSIS — R92323 Mammographic fibroglandular density, bilateral breasts: Secondary | ICD-10-CM | POA: Diagnosis not present

## 2023-01-14 DIAGNOSIS — M1611 Unilateral primary osteoarthritis, right hip: Secondary | ICD-10-CM | POA: Diagnosis not present

## 2023-01-14 DIAGNOSIS — M25551 Pain in right hip: Secondary | ICD-10-CM | POA: Diagnosis not present

## 2023-01-25 ENCOUNTER — Other Ambulatory Visit: Payer: Medicare HMO

## 2023-02-01 DIAGNOSIS — I10 Essential (primary) hypertension: Secondary | ICD-10-CM | POA: Diagnosis not present

## 2023-02-01 DIAGNOSIS — I499 Cardiac arrhythmia, unspecified: Secondary | ICD-10-CM | POA: Diagnosis not present

## 2023-02-01 DIAGNOSIS — Z8679 Personal history of other diseases of the circulatory system: Secondary | ICD-10-CM | POA: Diagnosis not present

## 2023-02-01 DIAGNOSIS — R9439 Abnormal result of other cardiovascular function study: Secondary | ICD-10-CM | POA: Diagnosis not present

## 2023-02-01 DIAGNOSIS — R5383 Other fatigue: Secondary | ICD-10-CM | POA: Diagnosis not present

## 2023-02-01 DIAGNOSIS — G4733 Obstructive sleep apnea (adult) (pediatric): Secondary | ICD-10-CM | POA: Diagnosis not present

## 2023-02-01 DIAGNOSIS — I2089 Other forms of angina pectoris: Secondary | ICD-10-CM | POA: Diagnosis not present

## 2023-02-04 DIAGNOSIS — M1611 Unilateral primary osteoarthritis, right hip: Secondary | ICD-10-CM | POA: Diagnosis not present

## 2023-02-04 DIAGNOSIS — M25551 Pain in right hip: Secondary | ICD-10-CM | POA: Diagnosis not present

## 2023-02-08 DIAGNOSIS — M1611 Unilateral primary osteoarthritis, right hip: Secondary | ICD-10-CM | POA: Diagnosis not present

## 2023-02-08 DIAGNOSIS — M25551 Pain in right hip: Secondary | ICD-10-CM | POA: Diagnosis not present

## 2023-02-16 DIAGNOSIS — Z85828 Personal history of other malignant neoplasm of skin: Secondary | ICD-10-CM | POA: Diagnosis not present

## 2023-02-16 DIAGNOSIS — L578 Other skin changes due to chronic exposure to nonionizing radiation: Secondary | ICD-10-CM | POA: Diagnosis not present

## 2023-02-16 DIAGNOSIS — Z872 Personal history of diseases of the skin and subcutaneous tissue: Secondary | ICD-10-CM | POA: Diagnosis not present

## 2023-02-16 DIAGNOSIS — L821 Other seborrheic keratosis: Secondary | ICD-10-CM | POA: Diagnosis not present

## 2023-02-17 DIAGNOSIS — M1611 Unilateral primary osteoarthritis, right hip: Secondary | ICD-10-CM | POA: Diagnosis not present

## 2023-02-17 DIAGNOSIS — M25551 Pain in right hip: Secondary | ICD-10-CM | POA: Diagnosis not present

## 2023-03-01 DIAGNOSIS — M1611 Unilateral primary osteoarthritis, right hip: Secondary | ICD-10-CM | POA: Diagnosis not present

## 2023-03-01 DIAGNOSIS — M25551 Pain in right hip: Secondary | ICD-10-CM | POA: Diagnosis not present

## 2023-03-03 DIAGNOSIS — M1611 Unilateral primary osteoarthritis, right hip: Secondary | ICD-10-CM | POA: Diagnosis not present

## 2023-03-03 DIAGNOSIS — M25551 Pain in right hip: Secondary | ICD-10-CM | POA: Diagnosis not present

## 2023-03-08 DIAGNOSIS — M25551 Pain in right hip: Secondary | ICD-10-CM | POA: Diagnosis not present

## 2023-03-08 DIAGNOSIS — M1611 Unilateral primary osteoarthritis, right hip: Secondary | ICD-10-CM | POA: Diagnosis not present

## 2023-03-10 DIAGNOSIS — M25551 Pain in right hip: Secondary | ICD-10-CM | POA: Diagnosis not present

## 2023-03-10 DIAGNOSIS — M1611 Unilateral primary osteoarthritis, right hip: Secondary | ICD-10-CM | POA: Diagnosis not present

## 2023-03-14 DIAGNOSIS — M25551 Pain in right hip: Secondary | ICD-10-CM | POA: Diagnosis not present

## 2023-03-14 DIAGNOSIS — M1611 Unilateral primary osteoarthritis, right hip: Secondary | ICD-10-CM | POA: Diagnosis not present

## 2023-03-14 DIAGNOSIS — R309 Painful micturition, unspecified: Secondary | ICD-10-CM | POA: Diagnosis not present

## 2023-03-22 ENCOUNTER — Other Ambulatory Visit: Payer: Self-pay | Admitting: Physician Assistant

## 2023-03-22 ENCOUNTER — Encounter: Payer: Self-pay | Admitting: Physician Assistant

## 2023-03-22 DIAGNOSIS — K649 Unspecified hemorrhoids: Secondary | ICD-10-CM

## 2023-03-22 MED ORDER — HYDROCORTISONE ACETATE 25 MG RE SUPP: 25.0000 mg | Freq: Two times a day (BID) | RECTAL | 2 refills | Status: AC

## 2023-03-22 NOTE — Progress Notes (Signed)
Patient requested refill of hydrocortisone suppositories to treat mild internal hemorrhoid bleeding.  I sent prescription to her pharmacy.  Patient notified through patient portal. Celso Amy, PA-C

## 2023-03-23 DIAGNOSIS — M25551 Pain in right hip: Secondary | ICD-10-CM | POA: Diagnosis not present

## 2023-03-23 DIAGNOSIS — M1611 Unilateral primary osteoarthritis, right hip: Secondary | ICD-10-CM | POA: Diagnosis not present

## 2023-03-29 DIAGNOSIS — M1611 Unilateral primary osteoarthritis, right hip: Secondary | ICD-10-CM | POA: Diagnosis not present

## 2023-03-29 DIAGNOSIS — M25551 Pain in right hip: Secondary | ICD-10-CM | POA: Diagnosis not present

## 2023-04-06 DIAGNOSIS — M7061 Trochanteric bursitis, right hip: Secondary | ICD-10-CM | POA: Diagnosis not present

## 2023-04-06 DIAGNOSIS — M1611 Unilateral primary osteoarthritis, right hip: Secondary | ICD-10-CM | POA: Diagnosis not present

## 2023-06-22 DIAGNOSIS — M1611 Unilateral primary osteoarthritis, right hip: Secondary | ICD-10-CM | POA: Diagnosis not present

## 2023-06-22 DIAGNOSIS — M7061 Trochanteric bursitis, right hip: Secondary | ICD-10-CM | POA: Diagnosis not present

## 2023-06-24 DIAGNOSIS — I10 Essential (primary) hypertension: Secondary | ICD-10-CM | POA: Diagnosis not present

## 2023-06-24 DIAGNOSIS — F32A Depression, unspecified: Secondary | ICD-10-CM | POA: Diagnosis not present

## 2023-06-24 DIAGNOSIS — M2550 Pain in unspecified joint: Secondary | ICD-10-CM | POA: Diagnosis not present

## 2023-06-24 DIAGNOSIS — Z0000 Encounter for general adult medical examination without abnormal findings: Secondary | ICD-10-CM | POA: Diagnosis not present

## 2023-06-24 DIAGNOSIS — Z1331 Encounter for screening for depression: Secondary | ICD-10-CM | POA: Diagnosis not present

## 2023-06-24 DIAGNOSIS — R7309 Other abnormal glucose: Secondary | ICD-10-CM | POA: Diagnosis not present

## 2023-06-24 DIAGNOSIS — K760 Fatty (change of) liver, not elsewhere classified: Secondary | ICD-10-CM | POA: Diagnosis not present

## 2023-06-24 DIAGNOSIS — I15 Secondary hypertension: Secondary | ICD-10-CM | POA: Diagnosis not present

## 2023-06-24 DIAGNOSIS — D7589 Other specified diseases of blood and blood-forming organs: Secondary | ICD-10-CM | POA: Diagnosis not present

## 2023-06-29 DIAGNOSIS — D044 Carcinoma in situ of skin of scalp and neck: Secondary | ICD-10-CM | POA: Diagnosis not present

## 2023-06-29 DIAGNOSIS — L570 Actinic keratosis: Secondary | ICD-10-CM | POA: Diagnosis not present

## 2023-06-29 DIAGNOSIS — D485 Neoplasm of uncertain behavior of skin: Secondary | ICD-10-CM | POA: Diagnosis not present

## 2023-07-28 ENCOUNTER — Other Ambulatory Visit: Payer: Self-pay

## 2023-07-29 DIAGNOSIS — M7061 Trochanteric bursitis, right hip: Secondary | ICD-10-CM | POA: Diagnosis not present

## 2023-07-29 DIAGNOSIS — M1611 Unilateral primary osteoarthritis, right hip: Secondary | ICD-10-CM | POA: Diagnosis not present

## 2023-08-03 DIAGNOSIS — Z9889 Other specified postprocedural states: Secondary | ICD-10-CM

## 2023-08-03 HISTORY — DX: Other specified postprocedural states: Z98.890

## 2023-08-04 ENCOUNTER — Ambulatory Visit: Payer: Medicare HMO | Admitting: Gastroenterology

## 2023-08-08 DIAGNOSIS — C4442 Squamous cell carcinoma of skin of scalp and neck: Secondary | ICD-10-CM | POA: Diagnosis not present

## 2023-08-08 DIAGNOSIS — D044 Carcinoma in situ of skin of scalp and neck: Secondary | ICD-10-CM | POA: Diagnosis not present

## 2023-08-11 DIAGNOSIS — M546 Pain in thoracic spine: Secondary | ICD-10-CM | POA: Diagnosis not present

## 2023-08-11 DIAGNOSIS — M25551 Pain in right hip: Secondary | ICD-10-CM | POA: Diagnosis not present

## 2023-08-11 DIAGNOSIS — R5383 Other fatigue: Secondary | ICD-10-CM | POA: Diagnosis not present

## 2023-08-11 DIAGNOSIS — M25552 Pain in left hip: Secondary | ICD-10-CM | POA: Diagnosis not present

## 2023-08-11 DIAGNOSIS — G4733 Obstructive sleep apnea (adult) (pediatric): Secondary | ICD-10-CM | POA: Diagnosis not present

## 2023-08-11 DIAGNOSIS — Z8679 Personal history of other diseases of the circulatory system: Secondary | ICD-10-CM | POA: Diagnosis not present

## 2023-08-11 DIAGNOSIS — I2510 Atherosclerotic heart disease of native coronary artery without angina pectoris: Secondary | ICD-10-CM | POA: Diagnosis not present

## 2023-08-11 DIAGNOSIS — I10 Essential (primary) hypertension: Secondary | ICD-10-CM | POA: Diagnosis not present

## 2023-08-12 DIAGNOSIS — M7061 Trochanteric bursitis, right hip: Secondary | ICD-10-CM | POA: Diagnosis not present

## 2023-08-17 DIAGNOSIS — M1611 Unilateral primary osteoarthritis, right hip: Secondary | ICD-10-CM | POA: Diagnosis not present

## 2023-08-17 DIAGNOSIS — M7061 Trochanteric bursitis, right hip: Secondary | ICD-10-CM | POA: Diagnosis not present

## 2023-08-22 ENCOUNTER — Other Ambulatory Visit: Payer: Self-pay

## 2023-08-22 ENCOUNTER — Telehealth: Payer: Self-pay | Admitting: Physician Assistant

## 2023-08-22 ENCOUNTER — Ambulatory Visit: Admitting: Gastroenterology

## 2023-08-22 NOTE — Telephone Encounter (Signed)
 The patient called to reschedule her 3:00 PM appointment today in [place]. She requested to reschedule with Dr. Baldomero Bone at the [facility] office. I informed her that Dr. Baldomero Bone is fully booked at the [facility] location and only has availability in [place]. The patient stated that she was previously able to schedule in [place]. I informed her that Dr. Baldomero Bone is leaving the practice at the end of May. She expressed concern that she was not previously informed of this and stated she would have waited to schedule had she known. I apologized for the miscommunication.

## 2023-08-26 DIAGNOSIS — M7061 Trochanteric bursitis, right hip: Secondary | ICD-10-CM | POA: Diagnosis not present

## 2023-08-26 DIAGNOSIS — M1611 Unilateral primary osteoarthritis, right hip: Secondary | ICD-10-CM | POA: Diagnosis not present

## 2023-08-29 DIAGNOSIS — M7061 Trochanteric bursitis, right hip: Secondary | ICD-10-CM | POA: Diagnosis not present

## 2023-08-29 DIAGNOSIS — M1611 Unilateral primary osteoarthritis, right hip: Secondary | ICD-10-CM | POA: Diagnosis not present

## 2023-08-30 DIAGNOSIS — R7303 Prediabetes: Secondary | ICD-10-CM | POA: Diagnosis not present

## 2023-08-30 DIAGNOSIS — I10 Essential (primary) hypertension: Secondary | ICD-10-CM | POA: Diagnosis not present

## 2023-08-31 DIAGNOSIS — M1611 Unilateral primary osteoarthritis, right hip: Secondary | ICD-10-CM | POA: Diagnosis not present

## 2023-08-31 DIAGNOSIS — M7061 Trochanteric bursitis, right hip: Secondary | ICD-10-CM | POA: Diagnosis not present

## 2023-09-05 DIAGNOSIS — M1611 Unilateral primary osteoarthritis, right hip: Secondary | ICD-10-CM | POA: Diagnosis not present

## 2023-09-05 DIAGNOSIS — M7061 Trochanteric bursitis, right hip: Secondary | ICD-10-CM | POA: Diagnosis not present

## 2023-09-07 DIAGNOSIS — M1611 Unilateral primary osteoarthritis, right hip: Secondary | ICD-10-CM | POA: Diagnosis not present

## 2023-09-07 DIAGNOSIS — M7061 Trochanteric bursitis, right hip: Secondary | ICD-10-CM | POA: Diagnosis not present

## 2023-09-13 DIAGNOSIS — M1611 Unilateral primary osteoarthritis, right hip: Secondary | ICD-10-CM | POA: Diagnosis not present

## 2023-09-13 DIAGNOSIS — M7061 Trochanteric bursitis, right hip: Secondary | ICD-10-CM | POA: Diagnosis not present

## 2023-09-19 DIAGNOSIS — R309 Painful micturition, unspecified: Secondary | ICD-10-CM | POA: Diagnosis not present

## 2023-09-20 DIAGNOSIS — M1611 Unilateral primary osteoarthritis, right hip: Secondary | ICD-10-CM | POA: Diagnosis not present

## 2023-09-20 DIAGNOSIS — M7061 Trochanteric bursitis, right hip: Secondary | ICD-10-CM | POA: Diagnosis not present

## 2023-09-30 DIAGNOSIS — M1611 Unilateral primary osteoarthritis, right hip: Secondary | ICD-10-CM | POA: Diagnosis not present

## 2023-09-30 DIAGNOSIS — M7061 Trochanteric bursitis, right hip: Secondary | ICD-10-CM | POA: Diagnosis not present

## 2023-10-14 DIAGNOSIS — K625 Hemorrhage of anus and rectum: Secondary | ICD-10-CM | POA: Diagnosis not present

## 2023-10-14 DIAGNOSIS — R5383 Other fatigue: Secondary | ICD-10-CM | POA: Diagnosis not present

## 2023-10-14 DIAGNOSIS — R194 Change in bowel habit: Secondary | ICD-10-CM | POA: Diagnosis not present

## 2023-10-17 DIAGNOSIS — K625 Hemorrhage of anus and rectum: Secondary | ICD-10-CM | POA: Diagnosis not present

## 2023-10-17 DIAGNOSIS — R194 Change in bowel habit: Secondary | ICD-10-CM | POA: Diagnosis not present

## 2023-10-17 DIAGNOSIS — R197 Diarrhea, unspecified: Secondary | ICD-10-CM | POA: Diagnosis not present

## 2023-10-17 DIAGNOSIS — R5383 Other fatigue: Secondary | ICD-10-CM | POA: Diagnosis not present

## 2023-10-25 DIAGNOSIS — Z1211 Encounter for screening for malignant neoplasm of colon: Secondary | ICD-10-CM | POA: Diagnosis not present

## 2023-11-24 DIAGNOSIS — I499 Cardiac arrhythmia, unspecified: Secondary | ICD-10-CM | POA: Diagnosis not present

## 2023-11-24 DIAGNOSIS — R42 Dizziness and giddiness: Secondary | ICD-10-CM | POA: Diagnosis not present

## 2023-11-24 DIAGNOSIS — I10 Essential (primary) hypertension: Secondary | ICD-10-CM | POA: Diagnosis not present

## 2023-11-24 DIAGNOSIS — G4733 Obstructive sleep apnea (adult) (pediatric): Secondary | ICD-10-CM | POA: Diagnosis not present

## 2023-11-24 DIAGNOSIS — R9439 Abnormal result of other cardiovascular function study: Secondary | ICD-10-CM | POA: Diagnosis not present

## 2023-11-24 DIAGNOSIS — R7303 Prediabetes: Secondary | ICD-10-CM | POA: Diagnosis not present

## 2023-11-24 DIAGNOSIS — Z8679 Personal history of other diseases of the circulatory system: Secondary | ICD-10-CM | POA: Diagnosis not present

## 2023-11-24 DIAGNOSIS — I2510 Atherosclerotic heart disease of native coronary artery without angina pectoris: Secondary | ICD-10-CM | POA: Diagnosis not present

## 2023-11-30 DIAGNOSIS — F32A Depression, unspecified: Secondary | ICD-10-CM | POA: Diagnosis not present

## 2023-11-30 DIAGNOSIS — I10 Essential (primary) hypertension: Secondary | ICD-10-CM | POA: Diagnosis not present

## 2023-11-30 DIAGNOSIS — R7303 Prediabetes: Secondary | ICD-10-CM | POA: Diagnosis not present

## 2023-11-30 DIAGNOSIS — R194 Change in bowel habit: Secondary | ICD-10-CM | POA: Diagnosis not present

## 2023-12-20 DIAGNOSIS — R42 Dizziness and giddiness: Secondary | ICD-10-CM | POA: Diagnosis not present

## 2024-01-10 DIAGNOSIS — J40 Bronchitis, not specified as acute or chronic: Secondary | ICD-10-CM | POA: Diagnosis not present

## 2024-01-16 DIAGNOSIS — Z0189 Encounter for other specified special examinations: Secondary | ICD-10-CM | POA: Diagnosis not present

## 2024-01-16 DIAGNOSIS — M1611 Unilateral primary osteoarthritis, right hip: Secondary | ICD-10-CM | POA: Diagnosis not present

## 2024-01-17 DIAGNOSIS — H524 Presbyopia: Secondary | ICD-10-CM | POA: Diagnosis not present

## 2024-01-17 DIAGNOSIS — Z961 Presence of intraocular lens: Secondary | ICD-10-CM | POA: Diagnosis not present

## 2024-01-17 DIAGNOSIS — Z9889 Other specified postprocedural states: Secondary | ICD-10-CM | POA: Diagnosis not present

## 2024-01-19 DIAGNOSIS — Z85828 Personal history of other malignant neoplasm of skin: Secondary | ICD-10-CM | POA: Diagnosis not present

## 2024-01-19 DIAGNOSIS — L821 Other seborrheic keratosis: Secondary | ICD-10-CM | POA: Diagnosis not present

## 2024-01-19 DIAGNOSIS — R208 Other disturbances of skin sensation: Secondary | ICD-10-CM | POA: Diagnosis not present

## 2024-01-19 DIAGNOSIS — L814 Other melanin hyperpigmentation: Secondary | ICD-10-CM | POA: Diagnosis not present

## 2024-01-19 DIAGNOSIS — L578 Other skin changes due to chronic exposure to nonionizing radiation: Secondary | ICD-10-CM | POA: Diagnosis not present

## 2024-01-19 DIAGNOSIS — D229 Melanocytic nevi, unspecified: Secondary | ICD-10-CM | POA: Diagnosis not present

## 2024-01-20 DIAGNOSIS — Z9889 Other specified postprocedural states: Secondary | ICD-10-CM | POA: Diagnosis not present

## 2024-01-20 DIAGNOSIS — M25562 Pain in left knee: Secondary | ICD-10-CM | POA: Diagnosis not present

## 2024-01-20 DIAGNOSIS — R9389 Abnormal findings on diagnostic imaging of other specified body structures: Secondary | ICD-10-CM | POA: Diagnosis not present

## 2024-01-20 DIAGNOSIS — R2689 Other abnormalities of gait and mobility: Secondary | ICD-10-CM | POA: Diagnosis not present

## 2024-01-20 DIAGNOSIS — M25561 Pain in right knee: Secondary | ICD-10-CM | POA: Diagnosis not present

## 2024-01-20 DIAGNOSIS — G8929 Other chronic pain: Secondary | ICD-10-CM | POA: Diagnosis not present

## 2024-01-20 DIAGNOSIS — R42 Dizziness and giddiness: Secondary | ICD-10-CM | POA: Diagnosis not present

## 2024-01-20 DIAGNOSIS — Z96642 Presence of left artificial hip joint: Secondary | ICD-10-CM | POA: Diagnosis not present

## 2024-01-20 DIAGNOSIS — J40 Bronchitis, not specified as acute or chronic: Secondary | ICD-10-CM | POA: Diagnosis not present

## 2024-02-24 DIAGNOSIS — M25551 Pain in right hip: Secondary | ICD-10-CM | POA: Diagnosis not present

## 2024-02-27 DIAGNOSIS — M25551 Pain in right hip: Secondary | ICD-10-CM | POA: Diagnosis not present

## 2024-03-02 DIAGNOSIS — M65342 Trigger finger, left ring finger: Secondary | ICD-10-CM | POA: Diagnosis not present

## 2024-03-02 DIAGNOSIS — I10 Essential (primary) hypertension: Secondary | ICD-10-CM | POA: Diagnosis not present

## 2024-03-02 DIAGNOSIS — R7303 Prediabetes: Secondary | ICD-10-CM | POA: Diagnosis not present

## 2024-03-02 DIAGNOSIS — M79642 Pain in left hand: Secondary | ICD-10-CM | POA: Diagnosis not present

## 2024-03-02 DIAGNOSIS — N1831 Chronic kidney disease, stage 3a: Secondary | ICD-10-CM | POA: Diagnosis not present

## 2024-03-02 DIAGNOSIS — M79641 Pain in right hand: Secondary | ICD-10-CM | POA: Diagnosis not present

## 2024-03-05 DIAGNOSIS — M25551 Pain in right hip: Secondary | ICD-10-CM | POA: Diagnosis not present

## 2024-03-08 ENCOUNTER — Ambulatory Visit

## 2024-03-08 DIAGNOSIS — L578 Other skin changes due to chronic exposure to nonionizing radiation: Secondary | ICD-10-CM | POA: Diagnosis not present

## 2024-03-08 DIAGNOSIS — W908XXA Exposure to other nonionizing radiation, initial encounter: Secondary | ICD-10-CM | POA: Diagnosis not present

## 2024-03-08 DIAGNOSIS — L299 Pruritus, unspecified: Secondary | ICD-10-CM | POA: Diagnosis not present

## 2024-03-08 DIAGNOSIS — L821 Other seborrheic keratosis: Secondary | ICD-10-CM | POA: Diagnosis not present

## 2024-03-08 DIAGNOSIS — L814 Other melanin hyperpigmentation: Secondary | ICD-10-CM

## 2024-03-08 MED ORDER — CLOBETASOL PROPIONATE 0.05 % EX SHAM: MEDICATED_SHAMPOO | CUTANEOUS | 1 refills | Status: AC

## 2024-03-08 NOTE — Progress Notes (Signed)
    Subjective   Sara Nguyen is a 78 y.o. female who presents for the following: Rash. 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 Rash on scalp, has changed hair products. Spot on top of head has been itchy. Occurring for the past 6 months. Uses Vanicream shampoo and conditioner.    Review of Systems:    No other skin or systemic complaints except as noted in HPI or Assessment and Plan.  The following portions of the chart were reviewed this encounter and updated as appropriate: medications, allergies, medical history  Relevant Medical History:  Personal history of melanoma - see medical history for full details  and Family history of skin cancer -     Objective  (SKPE) Well appearing patient in no apparent distress; mood and affect are within normal limits. Examination was performed of the: Focused Exam of: scalp and face    Examination notable for: mild erythema and scale on crown of scalp Lentigines, SK, actinic damage - face  Examination limited by: Clothing and Patient deferred removal       Assessment & Plan  (SKAP)   Scalp pruritus/dysesthesia - chronic, flaring, not at goal  - Disliked clobetasol  solution - continue gentle skin products  - Start clobetasol  shampoo 0.05% Apply 4 mL of medication to affected areas of scalp. Let sit for 15 minutes. Then add water, lather, and rinse out  Discussed side effect of super potent topical steroids including atrophy, dyspigmentation, striae, telangectasia, folliculitis, loss of skin pigment, hair growth, tachyphylaxis, risk of systemic absorption with missuse.  Continue Vanicream.  BENIGN SKIN FINDINGS  - Lentigines  - Seborrheic keratoses - Reassurance provided regarding the benign appearance of lesions noted on exam today; no treatment is indicated in the absence of symptoms/changes. - Reinforced importance of photoprotective strategies including liberal and frequent sunscreen  use of a broad-spectrum SPF 30 or greater, use of protective clothing, and sun avoidance for prevention of cutaneous malignancy and photoaging.  Counseled patient on the importance of regular self-skin monitoring as well as routine clinical skin examinations as scheduled.   ACTINIC DAMAGE - Chronic condition, secondary to cumulative UV/sun exposure - Recommend daily broad spectrum sunscreen SPF 30+ to sun-exposed areas, reapply every 2 hours as needed.  - Staying in the shade or wearing long sleeves, sun glasses (UVA+UVB protection) and wide brim hats (4-inch brim around the entire circumference of the hat) are also recommended for sun protection.  - Call for new or changing lesions.   Procedures, orders, diagnosis for this visit:    There are no diagnoses linked to this encounter.  Return to clinic: Return if symptoms worsen or fail to improve.  Sara Nguyen Sara Nguyen am acting as a neurosurgeon for Lauraine JAYSON Kanaris, MD   Documentation: I have reviewed the above documentation for accuracy and completeness, and I agree with the above.  Lauraine JAYSON Kanaris, MD

## 2024-03-09 DIAGNOSIS — M25551 Pain in right hip: Secondary | ICD-10-CM | POA: Diagnosis not present

## 2024-03-12 DIAGNOSIS — N390 Urinary tract infection, site not specified: Secondary | ICD-10-CM | POA: Diagnosis not present

## 2024-03-20 DIAGNOSIS — M25551 Pain in right hip: Secondary | ICD-10-CM | POA: Diagnosis not present

## 2024-03-21 DIAGNOSIS — I10 Essential (primary) hypertension: Secondary | ICD-10-CM | POA: Diagnosis not present

## 2024-03-21 DIAGNOSIS — Z8679 Personal history of other diseases of the circulatory system: Secondary | ICD-10-CM | POA: Diagnosis not present

## 2024-03-21 DIAGNOSIS — I2510 Atherosclerotic heart disease of native coronary artery without angina pectoris: Secondary | ICD-10-CM | POA: Diagnosis not present

## 2024-03-21 DIAGNOSIS — R7303 Prediabetes: Secondary | ICD-10-CM | POA: Diagnosis not present

## 2024-03-21 DIAGNOSIS — G4733 Obstructive sleep apnea (adult) (pediatric): Secondary | ICD-10-CM | POA: Diagnosis not present

## 2024-03-21 DIAGNOSIS — R42 Dizziness and giddiness: Secondary | ICD-10-CM | POA: Diagnosis not present

## 2024-04-10 ENCOUNTER — Encounter: Payer: Self-pay | Admitting: *Deleted

## 2024-04-23 ENCOUNTER — Ambulatory Visit: Admit: 2024-04-23

## 2024-04-23 ENCOUNTER — Encounter: Admission: RE | Payer: Self-pay

## 2024-04-23 ENCOUNTER — Ambulatory Visit: Admission: RE | Admit: 2024-04-23

## 2024-04-23 SURGERY — EGD (ESOPHAGOGASTRODUODENOSCOPY)
Anesthesia: General

## 2024-05-09 ENCOUNTER — Emergency Department

## 2024-05-09 ENCOUNTER — Encounter: Payer: Self-pay | Admitting: Emergency Medicine

## 2024-05-09 ENCOUNTER — Emergency Department
Admission: EM | Admit: 2024-05-09 | Discharge: 2024-05-09 | Disposition: A | Discharge status: Home / Self Care | Attending: Emergency Medicine | Admitting: Emergency Medicine

## 2024-05-09 ENCOUNTER — Other Ambulatory Visit: Payer: Self-pay

## 2024-05-09 DIAGNOSIS — I10 Essential (primary) hypertension: Secondary | ICD-10-CM | POA: Diagnosis not present

## 2024-05-09 DIAGNOSIS — R1031 Right lower quadrant pain: Secondary | ICD-10-CM | POA: Diagnosis present

## 2024-05-09 DIAGNOSIS — N3000 Acute cystitis without hematuria: Secondary | ICD-10-CM | POA: Diagnosis not present

## 2024-05-09 LAB — URINALYSIS, ROUTINE W REFLEX MICROSCOPIC
Bilirubin Urine: NEGATIVE
Glucose, UA: NEGATIVE mg/dL
Hgb urine dipstick: NEGATIVE
Ketones, ur: NEGATIVE mg/dL
Nitrite: POSITIVE — AB
Protein, ur: NEGATIVE mg/dL
Specific Gravity, Urine: 1.012 (ref 1.005–1.030)
pH: 7 (ref 5.0–8.0)

## 2024-05-09 LAB — CBC
HCT: 37.8 % (ref 36.0–46.0)
Hemoglobin: 12.4 g/dL (ref 12.0–15.0)
MCH: 32.8 pg (ref 26.0–34.0)
MCHC: 32.8 g/dL (ref 30.0–36.0)
MCV: 100 fL (ref 80.0–100.0)
Platelets: 284 K/uL (ref 150–400)
RBC: 3.78 MIL/uL — ABNORMAL LOW (ref 3.87–5.11)
RDW: 13.6 % (ref 11.5–15.5)
WBC: 5.4 K/uL (ref 4.0–10.5)
nRBC: 0 % (ref 0.0–0.2)

## 2024-05-09 LAB — COMPREHENSIVE METABOLIC PANEL WITH GFR
ALT: 20 U/L (ref 0–44)
AST: 27 U/L (ref 15–41)
Albumin: 4.2 g/dL (ref 3.5–5.0)
Alkaline Phosphatase: 126 U/L (ref 38–126)
Anion gap: 12 (ref 5–15)
BUN: 22 mg/dL (ref 8–23)
CO2: 24 mmol/L (ref 22–32)
Calcium: 10.4 mg/dL — ABNORMAL HIGH (ref 8.9–10.3)
Chloride: 101 mmol/L (ref 98–111)
Creatinine, Ser: 0.81 mg/dL (ref 0.44–1.00)
GFR, Estimated: >60 mL/min
Glucose, Bld: 121 mg/dL — ABNORMAL HIGH (ref 70–99)
Potassium: 4.1 mmol/L (ref 3.5–5.1)
Sodium: 137 mmol/L (ref 135–145)
Total Bilirubin: 0.4 mg/dL (ref 0.0–1.2)
Total Protein: 7.3 g/dL (ref 6.5–8.1)

## 2024-05-09 LAB — LIPASE, BLOOD: Lipase: 30 U/L (ref 11–51)

## 2024-05-09 MED ORDER — CEFPODOXIME PROXETIL 200 MG PO TABS: 200.0000 mg | ORAL_TABLET | Freq: Two times a day (BID) | ORAL | 0 refills | Status: AC

## 2024-05-09 MED ORDER — IOHEXOL 300 MG/ML  SOLN
100.0000 mL | Freq: Once | INTRAMUSCULAR | Status: AC | PRN
  Administered 2024-05-09: 100 mL via INTRAVENOUS

## 2024-05-09 NOTE — ED Provider Notes (Signed)
 "  [facility] Provider Note    Event Date/Time   First MD Initiated Contact with Patient 05/09/24 1121     (approximate)   History   Chief Complaint Abdominal Pain   HPI  Sara Nguyen is a 79 y.o. female with past medical history of hypertension and PAD who presents to the ED complaining of abdominal pain.  Patient reports that she has been dealing with increasing pain in the right lower quadrant of her abdomen since last night.  She describes the pain as sharp and worse with movement.  She has been feeling nauseous but has not vomited and denies any changes in her bowel movements.  She denies any fevers, but does report increasing dysuria over the past couple of days, for which she has been taking Azo.  She denies any pain in her flanks.     Physical Exam   Triage Vital Signs: ED Triage Vitals  Encounter Vitals Group     BP 05/09/24 1027 (!) 192/91     Girls Systolic BP Percentile --      Girls Diastolic BP Percentile --      Boys Systolic BP Percentile --      Boys Diastolic BP Percentile --      Pulse Rate 05/09/24 1027 76     Resp 05/09/24 1027 20     Temp 05/09/24 1027 97.8 F (36.6 C)     Temp Source 05/09/24 1027 Oral     SpO2 05/09/24 1027 99 %     Weight 05/09/24 1024 181 lb (82.1 kg)     Height 05/09/24 1024 5' 7 (1.702 m)     Head Circumference --      Peak Flow --      Pain Score 05/09/24 1024 7     Pain Loc --      Pain Education --      Exclude from Growth Chart --     Most recent vital signs: Vitals:   05/09/24 1027  BP: (!) 192/91  Pulse: 76  Resp: 20  Temp: 97.8 F (36.6 C)  SpO2: 99%    Constitutional: Alert and oriented. Eyes: Conjunctivae are normal. Head: Atraumatic. Nose: No congestion/rhinnorhea. Mouth/Throat: Mucous membranes are moist.  Cardiovascular: Normal rate, regular rhythm. Grossly normal heart sounds.  2+ radial pulses bilaterally. Respiratory: Normal respiratory effort.  No retractions.  Lungs CTAB. Gastrointestinal: Soft and tender to palpation in the right lower quadrant with no rebound or guarding. No distention. Musculoskeletal: No lower extremity tenderness nor edema.  Neurologic:  Normal speech and language. No gross focal neurologic deficits are appreciated.    ED Results / Procedures / Treatments   Labs (all labs ordered are listed, but only abnormal results are displayed) Labs Reviewed  COMPREHENSIVE METABOLIC PANEL WITH GFR - Abnormal; Notable for the following components:      Result Value   Glucose, Bld 121 (*)    Calcium 10.4 (*)    All other components within normal limits  CBC - Abnormal; Notable for the following components:   RBC 3.78 (*)    All other components within normal limits  URINALYSIS, ROUTINE W REFLEX MICROSCOPIC - Abnormal; Notable for the following components:   Color, Urine AMBER (*)    APPearance HAZY (*)    Nitrite POSITIVE (*)    Leukocytes,Ua MODERATE (*)    Bacteria, UA RARE (*)    All other components within normal limits  URINE CULTURE  LIPASE, BLOOD  RADIOLOGY CT abdomen/pelvis reviewed and interpreted by me with no inflammatory changes, focal fluid collections, or dilated bowel loops.  PROCEDURES:  Critical Care performed: No  Procedures   MEDICATIONS ORDERED IN ED: Medications  iohexol  (OMNIPAQUE ) 300 MG/ML solution 100 mL (100 mLs Intravenous Contrast Given 05/09/24 1314)     IMPRESSION / MDM / ASSESSMENT AND PLAN / ED COURSE  I reviewed the triage vital signs and the nursing notes.                              79 y.o. female with past medical history of hypertension and PAD who presents to the ED complaining of increasing right lower quadrant abdominal pain since last night with a couple days of dysuria.  Patient's presentation is most consistent with acute presentation with potential threat to life or bodily function.  Differential diagnosis includes, but is not limited to, UTI, kidney stone,  appendicitis, colitis, diverticulitis, dehydration, electrolyte abnormality, AKI.  Patient nontoxic-appearing and in no acute distress, vital signs are unremarkable.  Labs without significant anemia, leukocytosis, electrolyte abnormality, or AKI.  LFTs and lipase are unremarkable, urinalysis does appear concerning for infection.  Urine was sent for culture, CT imaging performed and negative for acute finding, no evidence of appendicitis or kidney stone.  Patient feeling well on reassessment and is appropriate for outpatient management of UTI, will treat with course of cefpodoxime  as she has previously tolerated cephalosporins.  She was counseled to return to the ED for new or worsening symptoms, patient agrees with plan.      FINAL CLINICAL IMPRESSION(S) / ED DIAGNOSES   Final diagnoses:  Right lower quadrant abdominal pain  Acute cystitis without hematuria     Rx / DC Orders   ED Discharge Orders          Ordered    cefpodoxime  (VANTIN ) 200 MG tablet  2 times daily        05/09/24 1355             Note:  This document was prepared using Dragon voice recognition software and may include unintentional dictation errors.   Willo Dunnings, MD 05/09/24 1356  "

## 2024-05-09 NOTE — ED Triage Notes (Signed)
 Pt via POV from home. Pt c/o RLQ abd pain and nausea, pt thinks she may have appendicitis. Pt states sx started last night. Pt has a hx of hysterectomy, no other abd surgeries. Pt is A&OX4 and NAD

## 2024-05-11 LAB — URINE CULTURE: Culture: >=100000 — AB
